# Patient Record
Sex: Female | Born: 1950 | Race: White | Hispanic: No | Smoking: Former smoker
Health system: Southern US, Community
[De-identification: ages and names within clinical notes are randomized; demographics above are authoritative.]

## PROBLEM LIST (undated history)

## (undated) DIAGNOSIS — I6529 Occlusion and stenosis of unspecified carotid artery: Secondary | ICD-10-CM

## (undated) DIAGNOSIS — K219 Gastro-esophageal reflux disease without esophagitis: Secondary | ICD-10-CM

## (undated) DIAGNOSIS — E079 Disorder of thyroid, unspecified: Secondary | ICD-10-CM

## (undated) HISTORY — DX: Occlusion and stenosis of unspecified carotid artery: I65.29

## (undated) HISTORY — DX: Gastro-esophageal reflux disease without esophagitis: K21.9

## (undated) HISTORY — DX: Disorder of thyroid, unspecified: E07.9

---

## 1975-09-09 HISTORY — PX: THYROIDECTOMY: SHX17

## 2003-09-09 HISTORY — PX: CHOLECYSTECTOMY: SHX55

## 2003-09-09 HISTORY — PX: GALLBLADDER SURGERY: SHX652

## 2012-11-30 ENCOUNTER — Ambulatory Visit (INDEPENDENT_AMBULATORY_CARE_PROVIDER_SITE_OTHER): Payer: BLUE CROSS/BLUE SHIELD | Admitting: Endocrinology

## 2012-11-30 ENCOUNTER — Encounter: Payer: Self-pay | Admitting: Endocrinology

## 2012-11-30 VITALS — BP 130/80 | HR 90 | Ht 65.0 in | Wt 144.0 lb

## 2012-11-30 DIAGNOSIS — F329 Major depressive disorder, single episode, unspecified: Secondary | ICD-10-CM

## 2012-11-30 DIAGNOSIS — E039 Hypothyroidism, unspecified: Secondary | ICD-10-CM

## 2012-11-30 DIAGNOSIS — E785 Hyperlipidemia, unspecified: Secondary | ICD-10-CM

## 2012-11-30 DIAGNOSIS — J45909 Unspecified asthma, uncomplicated: Secondary | ICD-10-CM

## 2012-11-30 DIAGNOSIS — K219 Gastro-esophageal reflux disease without esophagitis: Secondary | ICD-10-CM

## 2012-11-30 NOTE — Patient Instructions (Addendum)
Please continue the same levothyroxine (100 mcg/day). Please redo the blood test as scheduled in late April, and ask that a copy be sent to me.

## 2012-11-30 NOTE — Progress Notes (Signed)
  Subjective:    Patient ID: Alexandria Mitchell, female    DOB: 08/01/1951, 62 y.o.   MRN: [id number]  HPI Pt was dx'ed with hyperthyroidism 50 years ago.  She had "95% thyroidectomy" in 1977.  She has been on synthroid since then.  She has had frequent dosage changes, though (88-112 mcg/day).  She has moderate hair loss on the head, and assoc dry skin. No past medical history on file.  No past surgical history on file.  History   Social History  . Marital Status: Unknown    Spouse Name: N/A    Number of Children: N/A  . Years of Education: N/A   Occupational History  . Not on file.   Social History Main Topics  . Smoking status: Not on file  . Smokeless tobacco: Not on file  . Alcohol Use: Not on file  . Drug Use: Not on file  . Sexually Active: Not on file   Other Topics Concern  . Not on file   Social History Narrative  . No narrative on file    No current outpatient prescriptions on file prior to visit.   No current facility-administered medications on file prior to visit.    Not on File  No family history on file. Father had i-131 rx for hyperthyroidism BP 130/80  Pulse 90  Ht 5\' 5"  (1.651 m)  Wt 144 lb (65.318 kg)  BMI 23.96 kg/m2  SpO2 98%  Review of Systems denies depression, cramps, sob, weight gain, difficulty with congentration, constipation, numbness, blurry vision, rhinorrhea, easy bruising, and syncope.  She has arthralgias and brittle nails.     Objective:   Physical Exam VS: see vs page GEN: no distress HEAD: head: no deformity eyes: no periorbital swelling, no proptosis external nose and ears are normal mouth: no lesion seen NECK: a healed scar is present.  i do not appreciate a nodule in the thyroid or elsewhere in the neck CHEST WALL: no deformity LUNGS:  Clear to auscultation CV: reg rate and rhythm, no murmur ABD: abdomen is soft, nontender.  no hepatosplenomegaly.  not distended.  no hernia MUSCULOSKELETAL: muscle bulk and strength are  grossly normal.  no obvious joint swelling.  gait is normal and steady EXTEMITIES: no deformity.  no ulcer on the feet.  feet are of normal color and temp.  no edema PULSES: dorsalis pedis intact bilat.  no carotid bruit NEURO:  cn 2-12 grossly intact.   readily moves all 4's.  sensation is intact to touch on the feet SKIN:  Normal texture and temperature.  No rash or suspicious lesion is visible.   NODES:  None palpable at the neck PSYCH: alert, oriented x3.  Does not appear anxious nor depressed.   outside test results are reviewed: TSH=1.06 (pt says this was on synthroid 112 mcg/day)    Assessment & Plan:  Chronic hypothyroidism, on synthroid Hair loss: unlikely to be thyroid-related Brittle nails, not thyroid-related

## 2012-12-02 DIAGNOSIS — F329 Major depressive disorder, single episode, unspecified: Secondary | ICD-10-CM | POA: Insufficient documentation

## 2012-12-02 DIAGNOSIS — K219 Gastro-esophageal reflux disease without esophagitis: Secondary | ICD-10-CM | POA: Insufficient documentation

## 2012-12-02 DIAGNOSIS — E039 Hypothyroidism, unspecified: Secondary | ICD-10-CM | POA: Insufficient documentation

## 2012-12-02 DIAGNOSIS — J4540 Moderate persistent asthma, uncomplicated: Secondary | ICD-10-CM | POA: Insufficient documentation

## 2012-12-02 DIAGNOSIS — E785 Hyperlipidemia, unspecified: Secondary | ICD-10-CM | POA: Insufficient documentation

## 2013-01-12 ENCOUNTER — Other Ambulatory Visit: Payer: Self-pay | Admitting: *Deleted

## 2013-02-11 ENCOUNTER — Encounter: Payer: Self-pay | Admitting: Surgery

## 2013-02-14 ENCOUNTER — Other Ambulatory Visit (INDEPENDENT_AMBULATORY_CARE_PROVIDER_SITE_OTHER): Payer: BLUE CROSS/BLUE SHIELD | Admitting: *Deleted

## 2013-02-14 ENCOUNTER — Ambulatory Visit (INDEPENDENT_AMBULATORY_CARE_PROVIDER_SITE_OTHER): Payer: BLUE CROSS/BLUE SHIELD | Admitting: Surgery

## 2013-02-14 ENCOUNTER — Encounter: Payer: Self-pay | Admitting: Surgery

## 2013-02-14 ENCOUNTER — Encounter: Payer: BLUE CROSS/BLUE SHIELD | Admitting: Surgery

## 2013-02-14 ENCOUNTER — Other Ambulatory Visit: Payer: BLUE CROSS/BLUE SHIELD

## 2013-02-14 ENCOUNTER — Other Ambulatory Visit: Payer: Self-pay | Admitting: *Deleted

## 2013-02-14 DIAGNOSIS — I6529 Occlusion and stenosis of unspecified carotid artery: Secondary | ICD-10-CM

## 2013-02-14 NOTE — Progress Notes (Signed)
[facility]   Patient name: Alexandria Mitchell MRN: [id number] DOB: September 04, 1951 Sex: female   Referred by: Dr. Chestine Spore  Reason for referral:  Chief Complaint  Patient presents with  . 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 She originally had her carotid stenosis identified in 2005 during a [employer] valuation. She has been followed with serial ultrasound. Her most recent ultrasound identified greater than 80% left carotid stenosis. She is here today for further evaluation. She denies having any symptoms. Specifically, she denies numbness or weakness in either extremity. She denies slurred speech. She denies amaurosis fugax.  The patient has been started on simvastatin. Her most recent LDL cholesterol was 70. She has a remote history of smoking but has been quit for greater than 25 years. She has a history of thyroid disease. She underwent a thyroidectomy, of 95% of the gland, and 1977. She recently has had a difficult time regulating her thyroid medication. She is also treated with a proton pump inhibitor for her gastroesophageal reflux disease.  Past Medical History  Diagnosis Date  . Asthma   . GERD (gastroesophageal reflux disease)   . Thyroid disease     Past Surgical History  Procedure Laterality Date  . Gallbladder surgery  2005  . Thyroidectomy  1977    History   Social History  . Marital Status: Unknown    Spouse Name: N/A    Number of Children: N/A  . Years of Education: N/A   Occupational History  . Not on file.   Social History Main Topics  . Smoking status: Former Smoker    Types: Cigarettes    Start date: 02/15/1988  . Smokeless tobacco: Never Used  . Alcohol Use: 3.6 oz/week    6 Shots of liquor per week  . Drug Use: No  . Sexually Active: Not on file   Other Topics Concern  . Not on file   Social History  Narrative  . No narrative on file    Family History  Problem Relation Age of Onset  . Kidney disease Mother   . Kidney disease Father   . Cancer Father   . Heart disease Father   . Hyperlipidemia Father   . Hypertension Father   . Heart attack Father   . Diabetes Sister   . Heart disease Brother   . Hyperlipidemia Brother   . Hypertension Brother   . Peripheral vascular disease Brother     Allergies as of 02/14/2013 - Review Complete 02/14/2013  Allergen Reaction Noted  . Sulfa antibiotics Rash 02/14/2013    Current Outpatient Prescriptions on File Prior to Visit  Medication Sig Dispense Refill  . aspirin 81 MG tablet Take 81 mg by mouth daily.      . budesonide-formoterol (SYMBICORT) 160-4.5 MCG/ACT inhaler Inhale 2 puffs into the lungs 2 (two) times daily.      . cholecalciferol (VITAMIN D) 1000 UNITS tablet Take 1,000 Units by mouth daily.      . citalopram (CELEXA) 20 MG tablet Take 20 mg by mouth daily.      . furosemide (LASIX) 40 MG tablet Take 40 mg by mouth daily.      Marland Kitchen levothyroxine (SYNTHROID, LEVOTHROID) 100 MCG tablet Take 100 mcg by mouth daily.      . pantoprazole (PROTONIX) 40 MG tablet Take 40 mg by mouth daily.      Marland Kitchen  simvastatin (ZOCOR) 40 MG tablet Take 40 mg by mouth every evening.       No current facility-administered medications on file prior to visit.     REVIEW OF SYSTEMS: Cardiovascular: No chest pain, chest pressure, palpitations, orthopnea, or dyspnea on exertion. No claudication or rest pain,  No history of DVT or phlebitis. Pulmonary: Positive for asthma Neurologic: No weakness, paresthesias, aphasia, or amaurosis. No dizziness. Hematologic: No bleeding problems or clotting disorders. Musculoskeletal: No joint pain or joint swelling. Gastrointestinal: No blood in stool or hematemesis Genitourinary: No dysuria or hematuria. Psychiatric:: No history of major depression. Integumentary: No rashes or ulcers. Constitutional: No fever or  chills.  PHYSICAL EXAMINATION: General: The patient appears their stated age.  Vital signs are BP 134/62  Pulse 72  Ht 5\' 5"  (1.651 m)  Wt 141 lb 3.2 oz (64.048 kg)  BMI 23.5 kg/m2  SpO2 100% HEENT:  No gross abnormalities Pulmonary: Respirations are non-labored Abdomen: Soft and non-tender. Aorta is palpable and not aneurysmal  Musculoskeletal: There are no major deformities.   Neurologic: No focal weakness or paresthesias are detected, Skin: There are no ulcer or rashes noted. Psychiatric: The patient has normal affect. Cardiovascular: There is a regular rate and rhythm without significant murmur appreciated. No carotid bruits. Palpable pedal pulses bilaterally.  Diagnostic Studies: Carotid ultrasound was ordered and reviewed today. This shows a 40-59% right carotid stenosis and a 60-79% left carotid stenosis. The highest end diastolic velocity on the left is 68 cm/s  Outside Studies/Documentation Historical records were reviewed.  They showed high-grade left carotid stenosis, asymptomatic  Medication Changes: None  Assessment:  Bilateral carotid stenosis, asymptomatic, left greater than right Plan: I discussed the ultrasound findings today with the patient. The ultrasound performed in our office today suggests that her stenosis is less than 80%. This is in contrast to the outside ultrasound, however with an end-diastolic velocity of only 68 cm/s I would favor this being more accurate. Regardless, the patient remained asymptomatic. I therefore do not feel that we need to order a confirmatory test such as a CT scan or formal angiography. Rather, as long as she remains asymptomatic, I would repeat her duplex ultrasound in 6 months. The patient isn't agreeable with this plan. She will followup with me in 6 months. We did discuss that if she were to develop a sign or symptom of a stroke to go to the emergency department immediately.     Jorge Ny, M.D. [facility] [place] Office: [phone] Pager:  [phone]

## 2013-03-07 ENCOUNTER — Other Ambulatory Visit: Payer: BLUE CROSS/BLUE SHIELD

## 2013-03-07 ENCOUNTER — Encounter: Payer: BLUE CROSS/BLUE SHIELD | Admitting: Surgery

## 2013-08-03 ENCOUNTER — Encounter: Payer: Self-pay | Admitting: Surgery

## 2013-08-08 ENCOUNTER — Ambulatory Visit (INDEPENDENT_AMBULATORY_CARE_PROVIDER_SITE_OTHER): Payer: BC Managed Care – PPO | Admitting: Surgery

## 2013-08-08 ENCOUNTER — Ambulatory Visit (HOSPITAL_COMMUNITY)
Admission: RE | Admit: 2013-08-08 | Discharge: 2013-08-08 | Disposition: A | Discharge status: Home / Self Care | Payer: BC Managed Care – PPO | Source: Ambulatory Visit | Attending: Surgery | Admitting: Surgery

## 2013-08-08 ENCOUNTER — Encounter: Payer: Self-pay | Admitting: Surgery

## 2013-08-08 DIAGNOSIS — I6529 Occlusion and stenosis of unspecified carotid artery: Secondary | ICD-10-CM

## 2013-08-08 NOTE — Progress Notes (Signed)
Patient name: Alexandria Mitchell MRN: [id number] DOB: [birth year] Sex: female     Chief Complaint  Patient presents with  . Carotid    6 mo F/up with vascular Lab study.    HISTORY OF PRESENT ILLNESS: This is a friend Luna Kitchens.  She is back today for followup of her carotid occlusive disease.  She was originally referred with an ultrasound that showed greater than 80% left carotid stenosis.  This was repeated in my office and we could not reproduce those results.  Because the patient remained asymptomatic, I elected not to get a confirmatory study but rather to bring her back in 6 months for followup.  She denies any interval changes since I last saw her.  She denies any neurologic symptoms.  She continues to take an 81 mg aspirin and a statin for hypercholesterolemia.  Past Medical History  Diagnosis Date  . Asthma   . GERD (gastroesophageal reflux disease)   . Thyroid disease   . Carotid artery occlusion     Past Surgical History  Procedure Laterality Date  . Gallbladder surgery  2005  . Thyroidectomy  1977  . Cholecystectomy  2005    Gall Bladder    History   Social History  . Marital Status: Unknown    Spouse Name: N/A    Number of Children: N/A  . Years of Education: N/A   Occupational History  . Not on file.   Social History Main Topics  . Smoking status: Former Smoker    Types: Cigarettes    Start date: 02/15/1988  . Smokeless tobacco: Never Used  . Alcohol Use: 3.6 oz/week    6 Shots of liquor per week  . Drug Use: No  . Sexual Activity: Not on file   Other Topics Concern  . Not on file   Social History Narrative  . No narrative on file    Family History  Problem Relation Age of Onset  . Kidney disease Mother   . Kidney disease Father   . Cancer Father   . Heart disease Father     Heart Disease before age 56- Atrial Fibrillation  . Hyperlipidemia Father   . Hypertension Father   . Heart attack Father   . Diabetes Sister   . Heart  disease Brother     Heart Disease before age 66  . Hyperlipidemia Brother   . Hypertension Brother   . Peripheral vascular disease Brother     Allergies as of 08/08/2013 - Review Complete 08/08/2013  Allergen Reaction Noted  . Sulfa antibiotics Rash 02/14/2013    Current Outpatient Prescriptions on File Prior to Visit  Medication Sig Dispense Refill  . aspirin 81 MG tablet Take 81 mg by mouth daily.      . budesonide-formoterol (SYMBICORT) 160-4.5 MCG/ACT inhaler Inhale 2 puffs into the lungs 2 (two) times daily.      . cholecalciferol (VITAMIN D) 1000 UNITS tablet Take 1,000 Units by mouth daily.      . furosemide (LASIX) 40 MG tablet Take 40 mg by mouth daily.      Marland Kitchen levothyroxine (SYNTHROID, LEVOTHROID) 100 MCG tablet Take 100 mcg by mouth daily. 100 mg alt. 88 mg every other day      . pantoprazole (PROTONIX) 40 MG tablet Take 40 mg by mouth daily.      . simvastatin (ZOCOR) 40 MG tablet Take 40 mg by mouth every evening.      . citalopram (CELEXA) 20 MG tablet Take  20 mg by mouth daily.       No current facility-administered medications on file prior to visit.     REVIEW OF SYSTEMS: No changes from prior visit  PHYSICAL EXAMINATION:   Vital signs are BP 123/67  Pulse 70  Resp 16  Ht 5' 5.5" (1.664 m)  Wt 137 lb (62.143 kg)  BMI 22.44 kg/m2  SpO2 97% General: The patient appears their stated age. HEENT:  No gross abnormalities Pulmonary:  Non labored breathing Musculoskeletal: There are no major deformities. Neurologic: No focal weakness or paresthesias are detected, Skin: There are no ulcer or rashes noted. Psychiatric: The patient has normal affect. Cardiovascular: There is a regular rate and rhythm without significant murmur appreciated.  Faint left carotid bruit   Diagnostic Studies Carotid ultrasound was performed today.  This shows no significant interval changes.  The right side shows 40-59% stenosis in the left remained 60-79% stenosis with a and a  diastolic velocity of 75  Assessment: Asymptomatic bilateral carotid stenosis, left greater than right Plan: I discussed the ultrasound findings today with the patient.  I have recommended continuation of maximal medical therapy.  I would not entertain carotid endarterectomy or stenting until the stenosis reaches greater than 80%.  I have scheduled the patient to followup in one year with a repeat carotid ultrasound.  Jorge Ny, M.D. [facility] [place] Office: [phone] Pager:  [phone]

## 2013-08-09 NOTE — Addendum Note (Signed)
Addended by: Adria Dill L on: 08/09/2013 10:08 AM   Modules accepted: Orders

## 2014-08-14 ENCOUNTER — Encounter: Payer: Self-pay | Admitting: Surgery

## 2014-08-14 ENCOUNTER — Ambulatory Visit (HOSPITAL_COMMUNITY)
Admission: RE | Admit: 2014-08-14 | Discharge: 2014-08-14 | Disposition: A | Discharge status: Home / Self Care | Payer: Medicare HMO | Source: Ambulatory Visit | Attending: Surgery | Admitting: Surgery

## 2014-08-14 ENCOUNTER — Ambulatory Visit (INDEPENDENT_AMBULATORY_CARE_PROVIDER_SITE_OTHER): Payer: Medicare HMO | Admitting: Surgery

## 2014-08-14 VITALS — BP 134/61 | HR 72 | Ht 65.5 in | Wt 147.4 lb

## 2014-08-14 DIAGNOSIS — I6523 Occlusion and stenosis of bilateral carotid arteries: Secondary | ICD-10-CM

## 2014-08-14 NOTE — Addendum Note (Signed)
Addended by: Sharee PimpleMCCHESNEY, Mersedes Alber K on: 08/14/2014 04:12 PM   Modules accepted: Orders

## 2014-08-14 NOTE — Progress Notes (Signed)
Patient name: Alexandria Mitchell MRN: [id number] DOB: 11/30/1950 Sex: female     Chief Complaint  Patient presents with  . Re-evaluation    1 year f/u carotid    HISTORY OF PRESENT ILLNESS: This is a friend [name]. She is back today for followup of her carotid occlusive disease. She was originally referred with an ultrasound that showed greater than 80% left carotid stenosis. This was repeated in my office and we could not reproduce those results. Because the patient remained asymptomatic  She reports no new symptoms since I last saw her.  Specifically, she denies numbness or weakness in either extremity.  She denies slurred speech.  She denies amaurosis fugax.  She reports no symptoms of claudication.  Past Medical History  Diagnosis Date  . Asthma   . GERD (gastroesophageal reflux disease)   . Thyroid disease   . Carotid artery occlusion     Past Surgical History  Procedure Laterality Date  . Gallbladder surgery  2005  . Thyroidectomy  1977  . Cholecystectomy  2005    Gall Bladder    History   Social History  . Marital Status: Unknown    Spouse Name: N/A    Number of Children: N/A  . Years of Education: N/A   Occupational History  . Not on file.   Social History Main Topics  . Smoking status: Former Smoker    Types: Cigarettes    Start date: 02/15/1988  . Smokeless tobacco: Never Used  . Alcohol Use: 3.6 oz/week    6 Shots of liquor per week  . Drug Use: No  . Sexual Activity: Not on file   Other Topics Concern  . Not on file   Social History Narrative    Family History  Problem Relation Age of Onset  . Kidney disease Mother   . Kidney disease Father   . Cancer Father   . Heart disease Father     Heart Disease before age 65- Atrial Fibrillation  . Hyperlipidemia Father   . Hypertension Father   . Heart attack Father   . Diabetes Sister   . Heart disease Brother     Heart Disease before age 65  . Hyperlipidemia Brother   .  Hypertension Brother   . Peripheral vascular disease Brother     Allergies as of 08/14/2014 - Review Complete 08/14/2014  Allergen Reaction Noted  . Sulfa antibiotics Rash 02/14/2013    Current Outpatient Prescriptions on File Prior to Visit  Medication Sig Dispense Refill  . aspirin 81 MG tablet Take 81 mg by mouth daily.    . budesonide-formoterol (SYMBICORT) 160-4.5 MCG/ACT inhaler Inhale 2 puffs into the lungs 2 (two) times daily.    . cholecalciferol (VITAMIN D) 1000 UNITS tablet Take 1,000 Units by mouth daily.    Marland Kitchen. escitalopram (LEXAPRO) 20 MG tablet Take 20 mg by mouth daily.    . furosemide (LASIX) 40 MG tablet Take 40 mg by mouth daily.    Marland Kitchen. levothyroxine (SYNTHROID, LEVOTHROID) 100 MCG tablet Take 100 mcg by mouth daily. 100 mg alt. 88 mg every other day    . pantoprazole (PROTONIX) 40 MG tablet Take 40 mg by mouth daily.    . simvastatin (ZOCOR) 40 MG tablet Take 40 mg by mouth every evening.    . citalopram (CELEXA) 20 MG tablet Take 20 mg by mouth daily.     No current facility-administered medications on file prior to visit.     REVIEW OF  SYSTEMS: Cardiovascular: No chest pain, chest pressure, palpitations, orthopnea, or dyspnea on exertion. No claudication or rest pain,  No history of DVT or phlebitis. Pulmonary: No productive cough, or wheezing.  Positive for asthma Neurologic: No weakness, paresthesias, aphasia, or amaurosis. No dizziness. Hematologic: No bleeding problems or clotting disorders. Musculoskeletal: No joint pain or joint swelling. Gastrointestinal: No blood in stool or hematemesis Genitourinary: No dysuria or hematuria. Psychiatric:: No history of major depression. Integumentary: No rashes or ulcers. Constitutional: No fever or chills.  PHYSICAL EXAMINATION:   Vital signs are BP 134/61 mmHg  Pulse 72  Ht 5' 5.5" (1.664 m)  Wt 147 lb 6.4 oz (66.86 kg)  BMI 24.15 kg/m2  SpO2 98% General: The patient appears their stated age. HEENT:  No gross  abnormalities Pulmonary:  Non labored breathing Abdomen: Soft and non-tender.  No pulsatile mass Musculoskeletal: There are no major deformities. Neurologic: No focal weakness or paresthesias are detected, Skin: There are no ulcer or rashes noted. Psychiatric: The patient has normal affect. Cardiovascular: There is a regular rate and rhythm without significant murmur appreciated.  No carotid bruits.  Palpable posterior tibial pulse bilaterally   Diagnostic Studies I have ordered and reviewed her carotid Doppler study.  This shows 40-59 percent stenosis on the right and 60-79% stenosis on the left.  There is no significant interval change from 2014.  Assessment: Asymptomatic bilateral carotid stenosis, left greater than right Plan: I discussed the importance of yearly surveillance.  She remains asymptomatic.  She will follow up in one year with a repeat carotid Doppler study.  Jorge NyV. Wells Emmelyn Schmale IV, M.D. [facility] [place] Office: [phone] Pager:  [phone]

## 2014-09-06 DIAGNOSIS — J939 Pneumothorax, unspecified: Secondary | ICD-10-CM | POA: Insufficient documentation

## 2014-09-06 DIAGNOSIS — S2242XA Multiple fractures of ribs, left side, initial encounter for closed fracture: Secondary | ICD-10-CM | POA: Insufficient documentation

## 2014-09-06 DIAGNOSIS — W19XXXA Unspecified fall, initial encounter: Secondary | ICD-10-CM | POA: Insufficient documentation

## 2015-01-07 DIAGNOSIS — M85852 Other specified disorders of bone density and structure, left thigh: Secondary | ICD-10-CM | POA: Insufficient documentation

## 2015-05-18 ENCOUNTER — Other Ambulatory Visit: Payer: Self-pay | Admitting: *Deleted

## 2015-05-18 MED ORDER — OMALIZUMAB 150 MG ~~LOC~~ SOLR: 300.0000 mg | SUBCUTANEOUS | Status: DC

## 2015-08-14 ENCOUNTER — Encounter: Payer: Self-pay | Admitting: Surgery

## 2015-08-20 ENCOUNTER — Ambulatory Visit (INDEPENDENT_AMBULATORY_CARE_PROVIDER_SITE_OTHER): Payer: Medicare HMO | Admitting: Surgery

## 2015-08-20 ENCOUNTER — Ambulatory Visit (HOSPITAL_COMMUNITY)
Admission: RE | Admit: 2015-08-20 | Discharge: 2015-08-20 | Disposition: A | Discharge status: Home / Self Care | Payer: Medicare HMO | Source: Ambulatory Visit | Attending: Surgery | Admitting: Surgery

## 2015-08-20 ENCOUNTER — Encounter: Payer: Self-pay | Admitting: Surgery

## 2015-08-20 VITALS — BP 135/70 | HR 71 | Ht 65.5 in | Wt 149.0 lb

## 2015-08-20 DIAGNOSIS — I6523 Occlusion and stenosis of bilateral carotid arteries: Secondary | ICD-10-CM

## 2015-08-20 NOTE — Progress Notes (Signed)
Patient name: Alexandria Mitchell MRN: [id number] DOB: 1951-09-06 Sex: female     Chief Complaint  Patient presents with  . Re-evaluation    1 year f/u - carotid     HISTORY OF PRESENT ILLNESS: This is a [name]. She is back today for followup of her carotid occlusive disease. She was originally referred with an ultrasound that showed greater than 80% left carotid stenosis. This was repeated in my office and we could not reproduce those results. Because the patient remained asymptomatic, no intervention was performed.  Since I last saw her she has not had significant medical issues with the exception of a fall where she broke a stent had a punctured lung.  She denies symptoms.  Specifically, she denies numbness or weakness in either extremity.  She denies slurred speech.  She denies amaurosis fugax.  Past Medical History  Diagnosis Date  . Asthma   . GERD (gastroesophageal reflux disease)   . Thyroid disease   . Carotid artery occlusion     Past Surgical History  Procedure Laterality Date  . Gallbladder surgery  2005  . Thyroidectomy  1977  . Cholecystectomy  2005    Gall Bladder    Social History   Social History  . Marital Status: Unknown    Spouse Name: N/A  . Number of Children: N/A  . Years of Education: N/A   Occupational History  . Not on file.   Social History Main Topics  . Smoking status: Former Smoker    Types: Cigarettes    Start date: 02/15/1988  . Smokeless tobacco: Never Used  . Alcohol Use: 3.6 oz/week    6 Shots of liquor per week  . Drug Use: No  . Sexual Activity: Not on file   Other Topics Concern  . Not on file   Social History Narrative    Family History  Problem Relation Age of Onset  . Kidney disease Mother   . Kidney disease Father   . Cancer Father   . Heart disease Father     Heart Disease before age 43- Atrial Fibrillation  . Hyperlipidemia Father   . Hypertension Father   . Heart attack Father   .  Diabetes Sister   . Heart disease Brother     Heart Disease before age 42  . Hyperlipidemia Brother   . Hypertension Brother   . Peripheral vascular disease Brother     Allergies as of 08/20/2015 - Review Complete 08/20/2015  Allergen Reaction Noted  . Hydrochlorothiazide Rash 08/20/2015  . Sulfa antibiotics Rash 02/14/2013    Current Outpatient Prescriptions on File Prior to Visit  Medication Sig Dispense Refill  . aspirin 81 MG tablet Take 81 mg by mouth daily.    . budesonide-formoterol (SYMBICORT) 160-4.5 MCG/ACT inhaler Inhale 2 puffs into the lungs 2 (two) times daily.    . cholecalciferol (VITAMIN D) 1000 UNITS tablet Take 1,000 Units by mouth daily.    Marland Kitchen escitalopram (LEXAPRO) 20 MG tablet Take 20 mg by mouth daily.    . furosemide (LASIX) 40 MG tablet Take 40 mg by mouth daily.    Marland Kitchen levothyroxine (SYNTHROID, LEVOTHROID) 100 MCG tablet Take 100 mcg by mouth daily. 100 mg alt. 88 mg every other day    . pantoprazole (PROTONIX) 40 MG tablet Take 40 mg by mouth daily.    . citalopram (CELEXA) 20 MG tablet Take 20 mg by mouth daily.    . simvastatin (ZOCOR) 40 MG tablet Take  40 mg by mouth every evening.     Current Facility-Administered Medications on File Prior to Visit  Medication Dose Route Frequency Provider Last Rate Last Dose  . omalizumab Geoffry Paradise(XOLAIR) injection 300 mg  300 mg Subcutaneous Q28 days Mikki SanteeSokun Bhatti, MD         REVIEW OF SYSTEMS: Cardiovascular: No chest pain, chest pressure, palpitations, orthopnea, or dyspnea on exertion. No claudication or rest pain,  No history of DVT or phlebitis. Pulmonary: No productive cough, asthma or wheezing. Neurologic: No weakness, paresthesias, aphasia, or amaurosis. No dizziness. Hematologic: No bleeding problems or clotting disorders. Musculoskeletal: No joint pain or joint swelling. Gastrointestinal: No blood in stool or hematemesis Genitourinary: No dysuria or hematuria. Psychiatric:: No history of major  depression. Integumentary: No rashes or ulcers. Constitutional: No fever or chills.  PHYSICAL EXAMINATION:   Vital signs are  Filed Vitals:   08/20/15 1058 08/20/15 1059  BP: 122/61 135/70  Pulse: 71   Height: 5' 5.5" (1.664 m)   Weight: 149 lb (67.586 kg)   SpO2: 98%    Body mass index is 24.41 kg/(m^2). General: The patient appears their stated age. HEENT:  No gross abnormalities Pulmonary:  Non labored breathing Musculoskeletal: There are no major deformities. Neurologic: No focal weakness or paresthesias are detected, Skin: There are no ulcer or rashes noted. Psychiatric: The patient has normal affect. Cardiovascular: There is a regular rate and rhythm without significant murmur appreciated.  No carotid bruits   Diagnostic Studies I have ordered and reviewed her carotid duplex.  This reveals 40-59 percent right-sided stenosis and 60-an absent left sided stenosis.  There are no significant changes from prior visit  Assessment: Asymptomatic carotid stenosis, left carotid artery Plan: The patient remains asymptomatic and her carotid duplex remains stable.  We will continue with annual surveillance  V. Charlena CrossWells Haylynn Pha IV, M.D. [facility] [place] Office: [phone] Pager:  [phone]

## 2015-08-20 NOTE — Addendum Note (Signed)
Addended by: Adria DillELDRIDGE-LEWIS, Anastasha Ortez L on: 08/20/2015 03:48 PM   Modules accepted: Orders

## 2015-10-17 ENCOUNTER — Ambulatory Visit (INDEPENDENT_AMBULATORY_CARE_PROVIDER_SITE_OTHER): Payer: Medicare HMO | Admitting: Internal Medicine

## 2015-10-17 ENCOUNTER — Encounter: Payer: Self-pay | Admitting: Internal Medicine

## 2015-10-17 VITALS — BP 128/70 | HR 84 | Temp 98.5°F | Resp 20 | Ht 65.16 in | Wt 153.9 lb

## 2015-10-17 DIAGNOSIS — J4541 Moderate persistent asthma with (acute) exacerbation: Secondary | ICD-10-CM

## 2015-10-17 DIAGNOSIS — J310 Chronic rhinitis: Secondary | ICD-10-CM | POA: Insufficient documentation

## 2015-10-17 MED ORDER — AMOXICILLIN-POT CLAVULANATE 875-125 MG PO TABS: 1.0000 | ORAL_TABLET | Freq: Two times a day (BID) | ORAL | Status: DC

## 2015-10-17 MED ORDER — ALBUTEROL SULFATE (2.5 MG/3ML) 0.083% IN NEBU: 2.5000 mg | INHALATION_SOLUTION | RESPIRATORY_TRACT | Status: DC | PRN

## 2015-10-17 MED ORDER — METHYLPREDNISOLONE ACETATE 80 MG/ML IJ SUSP
80.0000 mg | Freq: Once | INTRAMUSCULAR | Status: AC
  Administered 2015-10-17: 80 mg via INTRAMUSCULAR

## 2015-10-17 NOTE — Patient Instructions (Signed)
Moderate persistent asthma  Currently not well controlled due to infection  Given DepoMedrol 80 mg IM today  Form completed for a nebulizer  Continue symbicort 160 mcg 2 puffs twice a day  Continue albuterol as needed  Chronic rhinitis  Currently not well controlled due to bronchitis  Start Augmentin 875 mg twice a day for 10 days  Continue Astelin 2 sprays each nostril once or twice a day as needed along with nasal saline lavage

## 2015-10-17 NOTE — Assessment & Plan Note (Signed)
   Currently not well controlled due to infection  Given DepoMedrol 80 mg IM today  Form completed for a nebulizer  Continue symbicort 160 mcg 2 puffs twice a day  Continue albuterol as needed

## 2015-10-17 NOTE — Assessment & Plan Note (Signed)
   Currently not well controlled due to bronchitis  Start Augmentin 875 mg twice a day for 10 days  Continue Astelin 2 sprays each nostril once or twice a day as needed along with nasal saline lavage

## 2015-10-17 NOTE — Progress Notes (Signed)
History of Present Illness: Alexandria Mitchell is a 65 y.o. female presenting for follow-up  HPI Comments: Asthma: For the past 2-3 weeks, she has had cough productive of yellow mucus, intermittent fever, fatigue, nasal drainage. She was seen at an urgent care center and given azithromycin, a prednisone burst and Tessalon Perles with only transient improvement in her symptoms.  Chronic rhinitis: Symptoms have been stable until recently as above  Urticaria: She is now off of Xolair without any return of her symptoms.   Assessment and Plan: Moderate persistent asthma  Currently not well controlled due to infection  Given DepoMedrol 80 mg IM today  Form completed for a nebulizer  Continue symbicort 160 mcg 2 puffs twice a day  Continue albuterol as needed  Chronic rhinitis  Currently not well controlled due to bronchitis  Start Augmentin 875 mg twice a day for 10 days  Continue Astelin 2 sprays each nostril once or twice a day as needed along with nasal saline lavage    Return in about 4 weeks (around 11/14/2015).  Medications ordered this encounter:  Meds ordered this encounter  Medications  . azelastine (ASTELIN) 0.1 % nasal spray    Sig: Place into the nose.  . calcium citrate-vitamin D (CITRACAL+D) 315-200 MG-UNIT tablet    Sig: Take by mouth.  . DISCONTD: EPINEPHrine (EPIPEN 2-PAK) 0.3 mg/0.3 mL IJ SOAJ injection    Sig:   . benzonatate (TESSALON) 200 MG capsule    Sig: one capsule prn    Refill:  0  . methylPREDNISolone acetate (DEPO-MEDROL) injection 80 mg    Sig:   . albuterol (PROVENTIL) (2.5 MG/3ML) 0.083% nebulizer solution    Sig: Take 3 mLs (2.5 mg total) by nebulization every 4 (four) hours as needed for wheezing or shortness of breath.    Dispense:  75 mL    Refill:  1  . amoxicillin-clavulanate (AUGMENTIN) 875-125 MG tablet    Sig: Take 1 tablet by mouth 2 (two) times daily.    Dispense:  20 tablet    Refill:  0    For infection     Diagnostics: Spirometry: FEV1 1.13L or 46%, FEV1/FVC  53%.  His is consistent with mild restriction and moderate airwaysstruction  Physical Exam: BP 128/70 mmHg  Pulse 84  Temp(Src) 98.5 F (36.9 C) (Oral)  Resp 20  Ht 5' 5.16" (1.655 m)  Wt 153 lb 14.1 oz (69.8 kg)  BMI 25.48 kg/m2   Physical Exam  Constitutional: She appears well-developed and well-nourished. No distress.  HENT:  Right Ear: External ear normal.  Left Ear: External ear normal.  Nose: Nose normal.  Mouth/Throat: Oropharynx is clear and moist.  Eyes: Conjunctivae are normal. Right eye exhibits no discharge. Left eye exhibits no discharge.  Cardiovascular: Normal rate, regular rhythm and normal heart sounds.   No murmur heard. Pulmonary/Chest: Effort normal. No respiratory distress. She has wheezes (occasional end expiratory wheeze, improved after albuterol). She has no rales.  Abdominal: Soft. Bowel sounds are normal.  Musculoskeletal: She exhibits no edema.  Lymphadenopathy:    She has no cervical adenopathy.  Neurological: She is alert.  Skin: No rash noted.  Vitals reviewed.   Medications: Current outpatient prescriptions:  .  aspirin 81 MG tablet, Take 81 mg by mouth daily., Disp: , Rfl:  .  atorvastatin (LIPITOR) 40 MG tablet, Take 40 mg by mouth., Disp: , Rfl:  .  azelastine (ASTELIN) 0.1 % nasal spray, Place into the nose., Disp: , Rfl:  .  benzonatate (  TESSALON) 200 MG capsule, one capsule prn, Disp: , Rfl: 0 .  budesonide-formoterol (SYMBICORT) 160-4.5 MCG/ACT inhaler, Inhale 2 puffs into the lungs 2 (two) times daily., Disp: , Rfl:  .  calcium citrate-vitamin D (CITRACAL+D) 315-200 MG-UNIT tablet, Take by mouth., Disp: , Rfl:  .  cholecalciferol (VITAMIN D) 1000 UNITS tablet, Take 1,000 Units by mouth daily., Disp: , Rfl:  .  escitalopram (LEXAPRO) 20 MG tablet, Take 20 mg by mouth daily., Disp: , Rfl:  .  furosemide (LASIX) 40 MG tablet, Take 40 mg by mouth daily., Disp: , Rfl:  .   levothyroxine (SYNTHROID, LEVOTHROID) 100 MCG tablet, Take 100 mcg by mouth daily. 100 mg alt. 88 mg every other day, Disp: , Rfl:  .  pantoprazole (PROTONIX) 40 MG tablet, Take 40 mg by mouth daily., Disp: , Rfl:  .  albuterol (PROVENTIL) (2.5 MG/3ML) 0.083% nebulizer solution, Take 3 mLs (2.5 mg total) by nebulization every 4 (four) hours as needed for wheezing or shortness of breath., Disp: 75 mL, Rfl: 1 .  amoxicillin-clavulanate (AUGMENTIN) 875-125 MG tablet, Take 1 tablet by mouth 2 (two) times daily., Disp: 20 tablet, Rfl: 0 .  citalopram (CELEXA) 20 MG tablet, Take 20 mg by mouth daily. Reported on 10/17/2015, Disp: , Rfl:   Drug Allergies:  Allergies  Allergen Reactions  . Hydrochlorothiazide Rash  . Sulfa Antibiotics Rash    ROS: Per HPI unless specifically indicated below Review of Systems  Thank you for the opportunity to care for this patient.  Please do not hesitate to contact me with questions.

## 2015-10-22 ENCOUNTER — Encounter: Payer: Self-pay | Admitting: *Deleted

## 2015-11-20 ENCOUNTER — Other Ambulatory Visit: Payer: Self-pay | Admitting: Allergy

## 2015-11-20 MED ORDER — AZELASTINE HCL 0.1 % NA SOLN: 2.0000 | Freq: Two times a day (BID) | NASAL | Status: DC

## 2015-11-21 ENCOUNTER — Ambulatory Visit: Payer: Medicare HMO | Admitting: Internal Medicine

## 2015-12-05 ENCOUNTER — Encounter: Payer: Self-pay | Admitting: Internal Medicine

## 2015-12-05 ENCOUNTER — Ambulatory Visit (INDEPENDENT_AMBULATORY_CARE_PROVIDER_SITE_OTHER): Payer: Medicare HMO | Admitting: Internal Medicine

## 2015-12-05 VITALS — BP 110/60 | HR 68 | Temp 98.3°F | Resp 16

## 2015-12-05 DIAGNOSIS — J4540 Moderate persistent asthma, uncomplicated: Secondary | ICD-10-CM | POA: Diagnosis not present

## 2015-12-05 DIAGNOSIS — J310 Chronic rhinitis: Secondary | ICD-10-CM | POA: Diagnosis not present

## 2015-12-05 NOTE — Progress Notes (Signed)
History of Present Illness: Alexandria Mitchell is a 65 y.o. female presenting for follow-up  HPI Comments: Asthma: At last visit, patient was treated with Depo-Medrol and Augmentin due to an asthma exacerbation exacerbated by infection. She now feels like she is back to her baseline. She is now coughing up a small amount of white mucus but her wheezing has resolved. She is still on Symbicort but would like to try Breo to see if it works on her.  Chronic rhinitis: Symptoms have been stable      Current Outpatient Prescriptions on File Prior to Visit  Medication Sig Dispense Refill  . albuterol (PROVENTIL) (2.5 MG/3ML) 0.083% nebulizer solution Take 3 mLs (2.5 mg total) by nebulization every 4 (four) hours as needed for wheezing or shortness of breath. 75 mL 1  . aspirin 81 MG tablet Take 81 mg by mouth daily.    Marland Kitchen atorvastatin (LIPITOR) 40 MG tablet Take 40 mg by mouth.    . budesonide-formoterol (SYMBICORT) 160-4.5 MCG/ACT inhaler Inhale 2 puffs into the lungs 2 (two) times daily.    . calcium citrate-vitamin D (CITRACAL+D) 315-200 MG-UNIT tablet Take by mouth.    . cholecalciferol (VITAMIN D) 1000 UNITS tablet Take 1,000 Units by mouth daily.    Marland Kitchen escitalopram (LEXAPRO) 20 MG tablet Take 20 mg by mouth daily.    . furosemide (LASIX) 40 MG tablet Take 40 mg by mouth daily.    Marland Kitchen levothyroxine (SYNTHROID, LEVOTHROID) 100 MCG tablet Take 100 mcg by mouth daily. 100 mg alt. 88 mg every other day    . pantoprazole (PROTONIX) 40 MG tablet Take 40 mg by mouth daily.    Marland Kitchen azelastine (ASTELIN) 0.1 % nasal spray Place 2 sprays into both nostrils 2 (two) times daily. (Patient not taking: Reported on 12/05/2015) 30 mL 5  . citalopram (CELEXA) 20 MG tablet Take 20 mg by mouth daily. Reported on 12/05/2015     No current facility-administered medications on file prior to visit.    Assessment and Plan: Moderate persistent asthma  Currently well controlled due to infection  Continue symbicort 160 mcg 2  puffs twice a day. Patient would like to try Breo ellipta. I've given her sample of 200 g; she will take 1 inhalation daily and she will let us know if she would like a prescription for it.  Continue albuterol as needed  If having frequent exacerbations, would likely benefit from Xolair.   Chronic rhinitis  Currently well controlled  Continue Astelin 2 sprays each nostril once or twice a day as needed along with nasal saline lavage     Return in about 6 months (around 06/06/2016).  Meds ordered this encounter  Medications  . DISCONTD: escitalopram (LEXAPRO) 10 MG tablet    Sig: TAKE 1 TABLET (10 MG TOTAL) BY MOUTH DAILY.    Refill:  5  . DISCONTD: fluconazole (DIFLUCAN) 100 MG tablet    Sig:   . ibuprofen (ADVIL,MOTRIN) 800 MG tablet    Sig: TAKE 1 TABLET EVERY 6 TO 8 HOURS AS NEEDED FOR PAIN    Refill:  0  . cetirizine (ZYRTEC) 10 MG tablet    Sig: Take 10 mg by mouth daily.    Diagnostics: Spirometry: FEV1 1.23 L or 50 %, FEV1/FVC  57 %.  This is consistent with mild restriction and moderate airways obstruction. No reversibility in the past.  Physical Exam: BP 110/60 mmHg  Pulse 68  Temp(Src) 98.3 F (36.8 C) (Oral)  Resp 16   Physical Exam  Constitutional: She appears well-developed and well-nourished. No distress.  HENT:  Right Ear: External ear normal.  Left Ear: External ear normal.  Nose: Nose normal.  Mouth/Throat: Oropharynx is clear and moist.  Eyes: Conjunctivae are normal. Right eye exhibits no discharge. Left eye exhibits no discharge.  Cardiovascular: Normal rate, regular rhythm and normal heart sounds.   No murmur heard. Pulmonary/Chest: Effort normal and breath sounds normal. No respiratory distress. She has no wheezes. She has no rales.  Abdominal: Soft. Bowel sounds are normal.  Musculoskeletal: She exhibits no edema.  Lymphadenopathy:    She has no cervical adenopathy.  Neurological: She is alert.  Skin: No rash noted.  Vitals  reviewed.   Drug Allergies:  Allergies  Allergen Reactions  . Hydrochlorothiazide Rash  . Sulfa Antibiotics Rash    ROS: Per HPI unless specifically indicated below Review of Systems  Thank you for the opportunity to care for this patient.  Please do not hesitate to contact me with questions.

## 2015-12-05 NOTE — Assessment & Plan Note (Addendum)
   Currently well controlled due to infection  Continue symbicort 160 mcg 2 puffs twice a day. Patient would like to try Breo ellipta. I've given her sample of 200 g; she will take 1 inhalation daily and she will let us know if she would like a prescription for it.  Continue albuterol as needed  If having frequent exacerbations, would likely benefit from Xolair.

## 2015-12-05 NOTE — Assessment & Plan Note (Signed)
   Currently well controlled  Continue Astelin 2 sprays each nostril once or twice a day as needed along with nasal saline lavage

## 2015-12-05 NOTE — Patient Instructions (Addendum)
Moderate persistent asthma  Currently well controlled due to infection  Continue symbicort 160 mcg 2 puffs twice a day. Patient would like to try Breo ellipta. I've given her sample of 200 g; she will take 1 inhalation daily and she will let us know if she would like a prescription for it.  Continue albuterol as needed  If having frequent exacerbations, would likely benefit from Xolair.   Chronic rhinitis  Currently well controlled  Continue Astelin 2 sprays each nostril once or twice a day as needed along with nasal saline lavage

## 2015-12-12 ENCOUNTER — Encounter: Payer: Self-pay | Admitting: *Deleted

## 2016-01-02 ENCOUNTER — Telehealth: Payer: Self-pay | Admitting: Allergy

## 2016-01-02 NOTE — Telephone Encounter (Signed)
Alexandria Mitchell CALLED AND SAID SHE WOULD LIKE A PRESCRIPTION FOR THE BREO 200. YOU GAVE HER A SAMPLE WHEN SHE WAS IN OFFICE. IT IS DOING GOOD.

## 2016-01-03 ENCOUNTER — Other Ambulatory Visit: Payer: Self-pay | Admitting: Allergy

## 2016-01-03 ENCOUNTER — Telehealth: Payer: Self-pay | Admitting: Allergy

## 2016-01-03 MED ORDER — FLUTICASONE FUROATE-VILANTEROL 200-25 MCG/INH IN AEPB: 1.0000 | INHALATION_SPRAY | Freq: Every day | RESPIRATORY_TRACT | Status: DC

## 2016-01-03 NOTE — Telephone Encounter (Signed)
OK to switch from Symbicort to Breo Ellipta 200 mg 1 inhalation daily

## 2016-01-03 NOTE — Telephone Encounter (Signed)
CALLED PATIENT AND LEFT MESSAGE THAT WE WERE CHANGING HER TO BREO PER DR BHATTI.

## 2016-01-03 NOTE — Telephone Encounter (Signed)
TALKED WITH PATIENT AND INFORMED HER NOT TO TAKE SYMBICORT.

## 2016-01-03 NOTE — Telephone Encounter (Signed)
PATIENT INFORMED.MEDS FAXED IN

## 2016-05-30 ENCOUNTER — Encounter: Payer: Self-pay | Admitting: Allergy & Immunology

## 2016-05-30 ENCOUNTER — Ambulatory Visit (INDEPENDENT_AMBULATORY_CARE_PROVIDER_SITE_OTHER): Payer: Medicare HMO | Admitting: Allergy & Immunology

## 2016-05-30 VITALS — BP 132/64 | HR 68 | Temp 98.4°F | Resp 16

## 2016-05-30 DIAGNOSIS — J4540 Moderate persistent asthma, uncomplicated: Secondary | ICD-10-CM | POA: Diagnosis not present

## 2016-05-30 DIAGNOSIS — J329 Chronic sinusitis, unspecified: Secondary | ICD-10-CM | POA: Diagnosis not present

## 2016-05-30 DIAGNOSIS — B999 Unspecified infectious disease: Secondary | ICD-10-CM

## 2016-05-30 DIAGNOSIS — Z87891 Personal history of nicotine dependence: Secondary | ICD-10-CM | POA: Insufficient documentation

## 2016-05-30 NOTE — Patient Instructions (Addendum)
1. Moderate persistent asthma, uncomplicated - Continue with Breo Ellipta 200/25 one puff every morning. - Continue with ProAir 4 puffs or the abuterol nebulizer every 4-6 hours as needed. - We will get a chest X-ray today to rule out pneumonia given the history of prolonged coughing. - We will get lab work today to see if you qualify for a new injectable medication: complete blood count with differential  2. Chronic sinusitis, unspecified location - We will get a sinus CT to see what the inside of your sinuses look like.  - We will hold off on additional antibiotics for now. - Use nasal saline rinses daily to help with your rhinitis symptoms.  3. Recurrent infections - We will get some screening labs to look for immunodeficiencies: immunoglobulin panel (IgG, IgA, IgM) and Pneumococcal titers.   4. Return in about 3 months (around 08/29/2016).  Please inform us of any Emergency Department visits, hospitalizations, or changes in symptoms. Call us before going to the ED for breathing or allergy symptoms since we might be able to fit you in for a sick visit. Feel free to contact us anytime with any questions, problems, or concerns.  It was a pleasure to meet you today!   Websites that have reliable patient information: 1. American Academy of Asthma, Allergy, and Immunology: www.aaaai.org 2. Food Allergy Research and Education (FARE): foodallergy.org 3. Mothers of Asthmatics: http://www.asthmacommunitynetwork.org 4. American College of Allergy, Asthma, and Immunology: www.acaai.org

## 2016-05-30 NOTE — Progress Notes (Signed)
FOLLOW UP  Date of Service/Encounter:  05/30/16   Assessment:   Moderate persistent asthma with COPD overlap  Chronic sinusitis, unspecified location  Recurrent infections    Plan/Recommendations:   1. Moderate persistent asthma with COPD overlap - Continue with Breo Ellipta 200/25 one puff every morning. - Continue with ProAir 4 puffs or the abuterol nebulizer every 4-6 hours as needed. - Lung function has overall trended downward over the last 1-2 years. - If no improvement at the next visit, we may consider a Pulmonology referral in conjunction with a chest CT.  - We will get a chest X-ray today to rule out pneumonia given the history of prolonged coughing. - We will get lab work today to see if you qualify for Nucala: complete blood count with differential  2. Chronic sinusitis, unspecified location - We will get a sinus CT to evaluate the sinuses. - Could consider referral to ENT if there is anything concerning. - We will hold off on additional antibiotics for now. - Use nasal saline rinses daily to help with your rhinitis symptoms.  3. Recurrent infections - We will get some screening labs to look for immunodeficiencies: immunoglobulin panel (IgG, IgA, IgM) and Pneumococcal titers.   4. Return in about 3 months (around 08/29/2016).   Subjective:   Alexandria Mitchell is a 65 y.o. female presenting today for follow up of  Chief Complaint  Patient presents with  . Cough  . Wheezing  .  Alexandria Mitchell has a history of the following: Patient Active Problem List   Diagnosis Date Noted  . History of smoking 05/30/2016  . Chronic rhinitis 10/17/2015  . Occlusion and stenosis of carotid artery without mention of cerebral infarction 02/14/2013  . Depressive disorder, not elsewhere classified 12/02/2012  . Dyslipidemia 12/02/2012  . GERD (gastroesophageal reflux disease) 12/02/2012  . Unspecified hypothyroidism 12/02/2012  . Moderate persistent asthma 12/02/2012     History obtained from: chart review and patient.  Alexandria Mitchell was referred by Vernona RiegerLARK,KATHERINE, MD.     Docia FurlKaron is a 65 y.o. female presenting for a follow up visit for persistent asthma and chronic rhinitis. The patient was last seen in March 2017 by Dr. Clydie BraunBhatti, who has since left the practice. At that time, she was doing well. She did have a slight cough. She remained on her Symbicort but was [place] in [place]. She was continued on Astelin 2 sprays per nostril 1-2 times per day as needed with nasal saline for her chronic rhinitis. She was started on Breo 200/25 1 inhalation daily.  Since the last visit, she has done fairly well. [facility] and [facility]. She is needing a rescue inhaler very rarely - she estimates that the last time that she needed it was last winter. Triggers for her asthma include illnesses but overall she does not have a good grasp of what causes her symptoms. She feels that her sinuses are not well controlled, and increased sinus drainage leads to asthma exacerbations. She was on Xolair for one year (Nov 2015 through Aug 2016) for chronic urticaria, otherwise no biologicals. She felt that her asthma was better controlled when she was on the Xolair. She stopped taking the Xolair because her insurance stopped covering it (she received it via a grant per the patient). Of note, her hives have not returned.   [place] did go to [place] in [place] and caught a cold that developed into bronchitis. She went to Urgent Care when she returned  and was started on azithromycin and prednisone. However, symptoms did not improve so she then saw her PCP and was placed on amoxicillin or doxycyline. She was also placed on a second course of prednisone with some improvement. She did use her albuterol nebulizer twice. Her last dose of albuterol was early in September 3rd or 4th). She remains on pantoprazole.  She does endorse constant nasal drainage throughout the year. Her  last nasal spray was $90. Review of her chart shows that she has tried multiple sprays including Atrovent, many nasal steroids, and Veramyst amongst others. She does use nasal saline rinses intermittently. She did have allergy testing in 2009 that wascompletely negative and obviously she has not been on allergy shots. She has seen ENT in the distant past. She did sinus surgery around 2000 or slightly thereafter. She has not followed up. She is in antibiotics approximately three times per year at the most for sinusitis. She did recently have a "sinus lift" in March 2017 due to complications during oral surgery.   Calysta's infectious history is otherwise unremarkable. She has no infections other than sinus infections. She has never needed to be hospitalized and has never need IV antibiotics. She has never had any bacterial GI infections, brain infections, skin infections, or bone/joint infections.   She does have a history of a pneumothorax in 2015 sustained after a fall that led to two rib fractures. She smoked for 20 years but quit several years ago. It does not appear that she has ever had an immune workup in the past. Otherwise, there have been no changes to the past medical history, surgical history, family history, or social history.      Review of Systems: a 14-point review of systems is pertinent for what is mentioned in HPI.  Otherwise, all other systems were negative. Constitutional: negative other than that listed in the HPI Eyes: negative other than that listed in the HPI Ears, nose, mouth, throat, and face: negative other than that listed in the HPI Respiratory: negative other than that listed in the HPI Cardiovascular: negative other than that listed in the HPI Gastrointestinal: negative other than that listed in the HPI Genitourinary: negative other than that listed in the HPI Integument: negative other than that listed in the HPI Hematologic: negative other than that listed in the  HPI Musculoskeletal: negative other than that listed in the HPI Neurological: negative other than that listed in the HPI Allergy/Immunologic: negative other than that listed in the HPI    Objective:   Blood pressure 132/64, pulse 68, temperature 98.4 F (36.9 C), temperature source Oral, resp. rate 16, SpO2 94 %. There is no height or weight on file to calculate BMI.   Physical Exam:  General: Alert, interactive, in no acute distress.Cooperative with the exam.  HEENT: TMs pearly gray, turbinates edematous with clear discharge, post-pharynx erythematous. Neck: Supple without thyromegaly. Adenopathy: no enlarged lymph nodes appreciated in the anterior cervical, occipital, axillary, epitrochlear, inguinal, or popliteal regions Lungs: Decreased breath sounds with expiratory wheezing bilaterally. No increased work of breathing. CV: Normal S1, S2 without murmurs. Capillary refill <2 seconds.  Abdomen: Nondistended, nontender. Skin: Warm and dry, without lesions or rashes. Extremities:  No clubbing, cyanosis or edema. Neuro:   Grossly intact.   Diagnostic studies:  Spirometry: results abnormal (FEV1: 1.25/50%, FVC: 1.87/58%, FEV1/FVC: 67%).    Spirometry consistent with possible restrictive disease and mild obstructive disease. According to previous notes, this is her baseline. She has had no reversibility in the  recent past but did have a 34% improvement in her FEV1 during her first visit to [place] in [id number]. However she was having some expiratory wheezing on exam today, therefore we did give an albuterol nebulizer. She did sound better with increased aeration in the bases. However there was no significant reversibility today.   Allergy Studies: None    Malachi Bonds, MD [facility]

## 2016-06-02 LAB — CBC WITH DIFFERENTIAL/PLATELET
BASOS ABS: 94 {cells}/uL (ref 0–200)
BASOS PCT: 1 %
EOS PCT: 2 %
Eosinophils Absolute: 188 cells/uL (ref 15–500)
HCT: 43.2 % (ref 35.0–45.0)
Hemoglobin: 14.2 g/dL (ref 11.7–15.5)
LYMPHS PCT: 34 %
Lymphs Abs: 3196 cells/uL (ref 850–3900)
MCH: 30.7 pg (ref 27.0–33.0)
MCHC: 32.9 g/dL (ref 32.0–36.0)
MCV: 93.5 fL (ref 80.0–100.0)
MONOS PCT: 6 %
MPV: 9.8 fL (ref 7.5–12.5)
Monocytes Absolute: 564 cells/uL (ref 200–950)
NEUTROS ABS: 5358 {cells}/uL (ref 1500–7800)
NEUTROS PCT: 57 %
PLATELETS: 293 10*3/uL (ref 140–400)
RBC: 4.62 MIL/uL (ref 3.80–5.10)
RDW: 14.6 % (ref 11.0–15.0)
WBC: 9.4 10*3/uL (ref 3.8–10.8)

## 2016-06-03 ENCOUNTER — Ambulatory Visit (HOSPITAL_BASED_OUTPATIENT_CLINIC_OR_DEPARTMENT_OTHER)
Admission: RE | Admit: 2016-06-03 | Discharge: 2016-06-03 | Disposition: A | Discharge status: Home / Self Care | Payer: Medicare HMO | Source: Ambulatory Visit | Attending: Allergy & Immunology | Admitting: Allergy & Immunology

## 2016-06-03 DIAGNOSIS — J42 Unspecified chronic bronchitis: Secondary | ICD-10-CM | POA: Insufficient documentation

## 2016-06-03 DIAGNOSIS — J323 Chronic sphenoidal sinusitis: Secondary | ICD-10-CM | POA: Insufficient documentation

## 2016-06-03 DIAGNOSIS — J4540 Moderate persistent asthma, uncomplicated: Secondary | ICD-10-CM | POA: Diagnosis present

## 2016-06-03 DIAGNOSIS — J329 Chronic sinusitis, unspecified: Secondary | ICD-10-CM | POA: Diagnosis present

## 2016-06-03 LAB — IGE: IGE (IMMUNOGLOBULIN E), SERUM: 3576 kU/L — AB (ref <115)

## 2016-06-03 LAB — IGG, IGA, IGM
IGG (IMMUNOGLOBIN G), SERUM: 862 mg/dL (ref 694–1618)
IGM, SERUM: 44 mg/dL — AB (ref 48–271)
IgA: 239 mg/dL (ref 81–463)

## 2016-06-05 LAB — STREP PNEUMONIAE 14 SEROTYPES IGG
Strep pneumo Type 12: <0.3
Strep pneumo Type 19: 0.5
Strep pneumo Type 4: 0.3
Strep pneumo Type 9: 0.4
Strep pneumoniae Type 1 Abs: 10
Strep pneumoniae Type 14 Abs: 1.5
Strep pneumoniae Type 18C Abs: 0.9
Strep pneumoniae Type 23F Abs: 0.4
Strep pneumoniae Type 3 Abs: 0.4
Strep pneumoniae Type 5 Abs: 4.8
Strep pneumoniae Type 6B Abs: 0.9
Strep pneumoniae Type 7F Abs: 9.7
Strep pneumoniae Type 8 Abs: 1.4
Strep pneumoniae Type 9N Abs: 0.6

## 2016-06-06 ENCOUNTER — Ambulatory Visit: Payer: Medicare HMO | Admitting: Pediatrics

## 2016-08-25 ENCOUNTER — Encounter (HOSPITAL_COMMUNITY): Payer: Medicare HMO

## 2016-08-25 ENCOUNTER — Ambulatory Visit: Payer: Medicare HMO | Admitting: Family

## 2016-09-30 ENCOUNTER — Encounter: Payer: Self-pay | Admitting: Family

## 2016-10-03 ENCOUNTER — Ambulatory Visit (HOSPITAL_COMMUNITY)
Admission: RE | Admit: 2016-10-03 | Discharge: 2016-10-03 | Disposition: A | Discharge status: Home / Self Care | Payer: Medicare HMO | Source: Ambulatory Visit | Attending: Family | Admitting: Family

## 2016-10-03 ENCOUNTER — Encounter: Payer: Self-pay | Admitting: Family

## 2016-10-03 ENCOUNTER — Ambulatory Visit (INDEPENDENT_AMBULATORY_CARE_PROVIDER_SITE_OTHER): Payer: Medicare HMO | Admitting: Family

## 2016-10-03 VITALS — BP 129/71 | HR 61 | Temp 97.5°F | Resp 20 | Ht 65.0 in | Wt 142.0 lb

## 2016-10-03 DIAGNOSIS — I6523 Occlusion and stenosis of bilateral carotid arteries: Secondary | ICD-10-CM

## 2016-10-03 LAB — VAS US CAROTID
LCCAPDIAS: 21 cm/s
LEFT ECA DIAS: -13 cm/s
LICAPDIAS: -94 cm/s
Left CCA dist dias: 20 cm/s
Left CCA dist sys: 79 cm/s
Left CCA prox sys: 88 cm/s
Left ICA dist dias: -28 cm/s
Left ICA dist sys: -82 cm/s
Left ICA prox sys: -281 cm/s
RCCAPDIAS: 22 cm/s
RIGHT CCA MID DIAS: 18 cm/s
RIGHT ECA DIAS: -9 cm/s
Right CCA prox sys: 72 cm/s
Right cca dist sys: -89 cm/s

## 2016-10-03 NOTE — Progress Notes (Signed)
Chief Complaint: Follow up Extracranial Carotid Artery Stenosis   History of Present Illness  Pocahontas Cohenour is a 66 y.o. female patient of Dr. 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 She was originally referred with an ultrasound that showed greater than 80% left carotid stenosis. This was repeated in our office and we could not reproduce those results. Because the patient remained asymptomatic, no intervention was performed.  She denies any known history of stroke or TIA. Specifically he denies a history of amaurosis fugax or monocular blindness, unilateral facial drooping, hemiplegia, or receptive or expressive aphasia.    Dr. Myra Gianotti last evaluated pt on 08-20-15. At that time carotid duplex showed 40-59 percent right-sided stenosis and 60-79% left sided stenosis.  There were no significant changes from prior visit The patient remained asymptomatic and her carotid duplex remained stable.  We will continue with annual surveillance  The patient denies New Medical or Surgical History.  Pt Diabetic: no Pt smoker: former smoker, quit in the 1980's, started at age 21  Pt meds include: Statin : yes ASA: yes Other anticoagulants/antiplatelets: no   Past Medical History:  Diagnosis Date  . Carotid artery occlusion   . GERD (gastroesophageal reflux disease)   . Thyroid disease     Social History Social History  Substance Use Topics  . Smoking status: Former Smoker    Types: Cigarettes    Start date: 02/15/1988  . Smokeless tobacco: Never Used  . Alcohol use 3.6 oz/week    6 Shots of liquor per week    Family History Family History  Problem Relation Age of Onset  . Kidney disease Mother   . Kidney disease Father   . Cancer Father   . Heart disease Father     Heart Disease before age 104- Atrial Fibrillation  . Hyperlipidemia Father   . Hypertension Father   . Heart attack Father   . Diabetes Sister   . Heart disease Brother     Heart  Disease before age 35  . Hyperlipidemia Brother   . Hypertension Brother   . Peripheral vascular disease Brother     Surgical History Past Surgical History:  Procedure Laterality Date  . CHOLECYSTECTOMY  2005   Gall Bladder  . GALLBLADDER SURGERY  2005  . THYROIDECTOMY  1977    Allergies  Allergen Reactions  . Hydrochlorothiazide Rash  . Sulfa Antibiotics Rash    Current Outpatient Prescriptions  Medication Sig Dispense Refill  . albuterol (PROVENTIL) (2.5 MG/3ML) 0.083% nebulizer solution Take 3 mLs (2.5 mg total) by nebulization every 4 (four) hours as needed for wheezing or shortness of breath. 75 mL 1  . aspirin 81 MG tablet Take 81 mg by mouth daily.    Marland Kitchen atorvastatin (LIPITOR) 40 MG tablet TAKE 1 TABLET (40 MG TOTAL) BY MOUTH DAILY.    Marland Kitchen azelastine (ASTELIN) 0.1 % nasal spray Place 2 sprays into both nostrils 2 (two) times daily. 30 mL 5  . calcium citrate-vitamin D (CITRACAL+D) 315-200 MG-UNIT tablet Take by mouth.    . cefUROXime (CEFTIN) 500 MG tablet Take 500 mg by mouth 2 (two) times daily with a meal.    . cetirizine (ZYRTEC) 10 MG tablet Take 10 mg by mouth daily.    . cholecalciferol (VITAMIN D) 1000 UNITS tablet Take 1,000 Units by mouth daily.    . Cyanocobalamin (RA VITAMIN B-12 TR) 1000 MCG TBCR Take by mouth.    . escitalopram (LEXAPRO) 10 MG tablet TAKE 1  TABLET (10 MG TOTAL) BY MOUTH DAILY.    . fluticasone furoate-vilanterol (BREO ELLIPTA) 200-25 MCG/INH AEPB Inhale 1 puff into the lungs daily. 1 each 5  . furosemide (LASIX) 40 MG tablet TAKE 1 TABLET EVERY MORNING AS NEEDED FOR EDEMA    . ibuprofen (ADVIL,MOTRIN) 800 MG tablet TAKE 1 TABLET EVERY 6 TO 8 HOURS AS NEEDED FOR PAIN  0  . levothyroxine (SYNTHROID, LEVOTHROID) 100 MCG tablet TAKE 1 TABLET EVERY DAY    . pantoprazole (PROTONIX) 40 MG tablet Take 40 mg by mouth daily.     No current facility-administered medications for this visit.     Review of Systems : See HPI for pertinent positives and  negatives.  Physical Examination  Vitals:   10/03/16 1310 10/03/16 1312  BP: 124/73 129/71  Pulse: 61   Resp: 20   Temp: 97.5 F (36.4 C)   TempSrc: Oral   SpO2: 98%   Weight: 142 lb (64.4 kg)   Height: 5\' 5"  (1.651 m)    Body mass index is 23.63 kg/m.  General: WDWN female in NAD GAIT: normal Eyes: PERRLA Pulmonary:  Respirations are non-labored, good air movement, CTAB.  Cardiac: regular rhythm, no detected murmur.  VASCULAR EXAM Carotid Bruits Right Left   Negative Negative    Aorta is not palpable. Radial pulses are 2+ palpable and equal.                                                                                                                            LE Pulses Right Left       POPLITEAL  not palpable   not palpable       POSTERIOR TIBIAL   palpable    palpable        DORSALIS PEDIS      ANTERIOR TIBIAL not palpable  not palpable     Gastrointestinal: soft, nontender, BS WNL, no r/g, no palpable masses.  Musculoskeletal: No muscle atrophy/wasting. M/S 5/5 throughout, extremities without ischemic changes.  Neurologic: A&O X 3; Appropriate Affect, Speech is normal CN 2-12 intact, pain and light touch intact in extremities, Motor exam as listed above.     Assessment: Laraina Sulton is a 66 y.o. female who has no history of stroke or TIA.  She was originally referred with an ultrasound that showed greater than 80% left carotid stenosis. This was repeated in our office and we could not reproduce those results. Because the patient remained asymptomatic, no intervention was performed.   Fortunately she does not have DM, quit smoking in the 1980's, has a normal BMI, and remains physically active.   DATA Today's carotid duplex suggests 40-59% right ICA stenosis and 60-79% left ICA stenosis. EDV at proximal left ICA is 94 cm/s today, was 74 cm/s on 08-20-15. Bilateral vertebral artery flow is antegrade.  Bilateral subclavian artery waveforms are  normal.  Slight increase in left ICA stenosis but category remains 60-79% stenosis compared to the last  exam on 08-20-15.    Plan: Follow-up in 6 months with Carotid Duplex scan.   I discussed in depth with the patient the nature of atherosclerosis, and emphasized the importance of maximal medical management including strict control of blood pressure, blood glucose, and lipid levels, obtaining regular exercise, and continued cessation of smoking.  The patient is aware that without maximal medical management the underlying atherosclerotic disease process will progress, limiting the benefit of any interventions. The patient was given information about stroke prevention and what symptoms should prompt the patient to seek immediate medical care. Thank you for allowing us to participate in this patient's care.  Charisse MarchSuzanne Nickel, RN, MSN, FNP-C [facility] [place] Office: [phone]  Clinic Physician: Imogene BurnChen  10/03/16 1:17 PM

## 2016-10-03 NOTE — Patient Instructions (Signed)
Stroke Prevention Some medical conditions and behaviors are associated with an increased chance of having a stroke. You may prevent a stroke by making healthy choices and managing medical conditions. How can I reduce my risk of having a stroke?  Stay physically active. Get at least 30 minutes of activity on most or all days.  Do not smoke. It may also be helpful to avoid exposure to secondhand smoke.  Limit alcohol use. Moderate alcohol use is considered to be:  No more than 2 drinks per day for men.  No more than 1 drink per day for nonpregnant women.  Eat healthy foods. This involves:  Eating 5 or more servings of fruits and vegetables a day.  Making dietary changes that address high blood pressure (hypertension), high cholesterol, diabetes, or obesity.  Manage your cholesterol levels.  Making food choices that are high in fiber and low in saturated fat, trans fat, and cholesterol may control cholesterol levels.  Take any prescribed medicines to control cholesterol as directed by your health care provider.  Manage your diabetes.  Controlling your carbohydrate and sugar intake is recommended to manage diabetes.  Take any prescribed medicines to control diabetes as directed by your health care provider.  Control your hypertension.  Making food choices that are low in salt (sodium), saturated fat, trans fat, and cholesterol is recommended to manage hypertension.  Ask your health care provider if you need treatment to lower your blood pressure. Take any prescribed medicines to control hypertension as directed by your health care provider.  If you are 18-39 years of age, have your blood pressure checked every 3-5 years. If you are 40 years of age or older, have your blood pressure checked every year.  Maintain a healthy weight.  Reducing calorie intake and making food choices that are low in sodium, saturated fat, trans fat, and cholesterol are recommended to manage  weight.  Stop drug abuse.  Avoid taking birth control pills.  Talk to your health care provider about the risks of taking birth control pills if you are over 35 years old, smoke, get migraines, or have ever had a blood clot.  Get evaluated for sleep disorders (sleep apnea).  Talk to your health care provider about getting a sleep evaluation if you snore a lot or have excessive sleepiness.  Take medicines only as directed by your health care provider.  For some people, aspirin or blood thinners (anticoagulants) are helpful in reducing the risk of forming abnormal blood clots that can lead to stroke. If you have the irregular heart rhythm of atrial fibrillation, you should be on a blood thinner unless there is a good reason you cannot take them.  Understand all your medicine instructions.  Make sure that other conditions (such as anemia or atherosclerosis) are addressed. Get help right away if:  You have sudden weakness or numbness of the face, arm, or leg, especially on one side of the body.  Your face or eyelid droops to one side.  You have sudden confusion.  You have trouble speaking (aphasia) or understanding.  You have sudden trouble seeing in one or both eyes.  You have sudden trouble walking.  You have dizziness.  You have a loss of balance or coordination.  You have a sudden, severe headache with no known cause.  You have new chest pain or an irregular heartbeat. Any of these symptoms may represent a serious problem that is an emergency. Do not wait to see if the symptoms will go away.   Get medical help at once. Call your local emergency services (911 in [place] Do not drive yourself to the hospital. This information is not intended to replace advice given to you by your health care provider. Make sure you discuss any questions you have with your health care provider. Document Released: 10/02/2004 Document Revised: 01/31/2016 Document Reviewed: 02/25/2013 Elsevier  Interactive Patient Education  2017 Elsevier Inc.  

## 2016-10-07 NOTE — Addendum Note (Signed)
Addended by: Burton ApleyPETTY, Jazzalyn Loewenstein A on: 10/07/2016 03:14 PM   Modules accepted: Orders

## 2017-03-10 ENCOUNTER — Other Ambulatory Visit: Payer: Self-pay

## 2017-03-10 MED ORDER — ALBUTEROL SULFATE (2.5 MG/3ML) 0.083% IN NEBU: 2.5000 mg | INHALATION_SOLUTION | RESPIRATORY_TRACT | 0 refills | Status: DC | PRN

## 2017-03-10 NOTE — Telephone Encounter (Signed)
RF on Albuterol 0.083% x 1 with no refills. Pt needs an OV 

## 2017-03-30 ENCOUNTER — Encounter: Payer: Self-pay | Admitting: Family

## 2017-04-10 ENCOUNTER — Encounter: Payer: Self-pay | Admitting: Family

## 2017-04-10 ENCOUNTER — Ambulatory Visit (HOSPITAL_COMMUNITY)
Admission: RE | Admit: 2017-04-10 | Discharge: 2017-04-10 | Disposition: A | Discharge status: Home / Self Care | Payer: Medicare HMO | Source: Ambulatory Visit | Attending: Family | Admitting: Family

## 2017-04-10 ENCOUNTER — Ambulatory Visit (INDEPENDENT_AMBULATORY_CARE_PROVIDER_SITE_OTHER): Payer: Medicare HMO | Admitting: Family

## 2017-04-10 VITALS — BP 132/72 | HR 74 | Temp 98.6°F | Resp 18 | Ht 65.0 in | Wt 143.8 lb

## 2017-04-10 DIAGNOSIS — I6523 Occlusion and stenosis of bilateral carotid arteries: Secondary | ICD-10-CM

## 2017-04-10 LAB — VAS US CAROTID
LCCADDIAS: 22 cm/s
LCCADSYS: 86 cm/s
LEFT ECA DIAS: -6 cm/s
LEFT VERTEBRAL DIAS: -15 cm/s
LICADSYS: -83 cm/s
Left CCA prox dias: 18 cm/s
Left CCA prox sys: 97 cm/s
Left ICA dist dias: -28 cm/s
RCCADSYS: -98 cm/s
RCCAPSYS: 83 cm/s
RIGHT CCA MID DIAS: 13 cm/s
RIGHT ECA DIAS: -2 cm/s
RIGHT VERTEBRAL DIAS: -7 cm/s
Right CCA prox dias: 18 cm/s

## 2017-04-10 NOTE — Patient Instructions (Signed)
Stroke Prevention Some medical conditions and behaviors are associated with an increased chance of having a stroke. You may prevent a stroke by making healthy choices and managing medical conditions. How can I reduce my risk of having a stroke?  Stay physically active. Get at least 30 minutes of activity on most or all days.  Do not smoke. It may also be helpful to avoid exposure to secondhand smoke.  Limit alcohol use. Moderate alcohol use is considered to be:  No more than 2 drinks per day for men.  No more than 1 drink per day for nonpregnant women.  Eat healthy foods. This involves:  Eating 5 or more servings of fruits and vegetables a day.  Making dietary changes that address high blood pressure (hypertension), high cholesterol, diabetes, or obesity.  Manage your cholesterol levels.  Making food choices that are high in fiber and low in saturated fat, trans fat, and cholesterol may control cholesterol levels.  Take any prescribed medicines to control cholesterol as directed by your health care provider.  Manage your diabetes.  Controlling your carbohydrate and sugar intake is recommended to manage diabetes.  Take any prescribed medicines to control diabetes as directed by your health care provider.  Control your hypertension.  Making food choices that are low in salt (sodium), saturated fat, trans fat, and cholesterol is recommended to manage hypertension.  Ask your health care provider if you need treatment to lower your blood pressure. Take any prescribed medicines to control hypertension as directed by your health care provider.  If you are 18-39 years of age, have your blood pressure checked every 3-5 years. If you are 40 years of age or older, have your blood pressure checked every year.  Maintain a healthy weight.  Reducing calorie intake and making food choices that are low in sodium, saturated fat, trans fat, and cholesterol are recommended to manage  weight.  Stop drug abuse.  Avoid taking birth control pills.  Talk to your health care provider about the risks of taking birth control pills if you are over 35 years old, smoke, get migraines, or have ever had a blood clot.  Get evaluated for sleep disorders (sleep apnea).  Talk to your health care provider about getting a sleep evaluation if you snore a lot or have excessive sleepiness.  Take medicines only as directed by your health care provider.  For some people, aspirin or blood thinners (anticoagulants) are helpful in reducing the risk of forming abnormal blood clots that can lead to stroke. If you have the irregular heart rhythm of atrial fibrillation, you should be on a blood thinner unless there is a good reason you cannot take them.  Understand all your medicine instructions.  Make sure that other conditions (such as anemia or atherosclerosis) are addressed. Get help right away if:  You have sudden weakness or numbness of the face, arm, or leg, especially on one side of the body.  Your face or eyelid droops to one side.  You have sudden confusion.  You have trouble speaking (aphasia) or understanding.  You have sudden trouble seeing in one or both eyes.  You have sudden trouble walking.  You have dizziness.  You have a loss of balance or coordination.  You have a sudden, severe headache with no known cause.  You have new chest pain or an irregular heartbeat. Any of these symptoms may represent a serious problem that is an emergency. Do not wait to see if the symptoms will go away.   Get medical help at once. Call your local emergency services (911 in [place] Do not drive yourself to the hospital. This information is not intended to replace advice given to you by your health care provider. Make sure you discuss any questions you have with your health care provider. Document Released: 10/02/2004 Document Revised: 01/31/2016 Document Reviewed: 02/25/2013 Elsevier  Interactive Patient Education  2017 Elsevier Inc.  

## 2017-04-10 NOTE — Progress Notes (Signed)
Chief Complaint: Follow up Extracranial Carotid Artery Stenosis   History of Present Illness  Alexandria Mitchell is a 66 y.o. female patient of Dr. Myra Gianotti who returns today for followup of her carotid artery occlusive disease. She was originally referred with an ultrasound that showed greater than 80% left carotid stenosis. This was repeated in our office and we could not reproduce those results. Because the patient remained asymptomatic, no intervention was performed.  She denies any known history of stroke or TIA. Specifically she deniesa history of amaurosis fugax or monocular blindness, unilateral facial drooping, hemiplegia, orreceptive or expressive aphasia.    Dr. Myra Gianotti last evaluated pt on 08-20-15. At that time carotid duplex showed 40-59 percent right-sided stenosis and 60-79% left sided stenosis. There were no significant changes from prior visit The patient remained asymptomatic and her carotid duplex remained stable. We will continue with surveillance.   Pt Diabetic: no Pt smoker: former smoker, quit in the 1980's, started at age 47  Pt meds include: Statin : yes ASA: yes Other anticoagulants/antiplatelets: no   Past Medical History:  Diagnosis Date  . Carotid artery occlusion   . GERD (gastroesophageal reflux disease)   . Thyroid disease     Social History Social History  Substance Use Topics  . Smoking status: Former Smoker    Types: Cigarettes    Start date: 02/15/1988  . Smokeless tobacco: Never Used  . Alcohol use 3.6 oz/week    6 Shots of liquor per week    Family History Family History  Problem Relation Age of Onset  . Kidney disease Mother   . Kidney disease Father   . Cancer Father   . Heart disease Father        Heart Disease before age 45- Atrial Fibrillation  . Hyperlipidemia Father   . Hypertension Father   . Heart attack Father   . Diabetes Sister   . Heart disease Brother        Heart Disease before age 57  . Hyperlipidemia  Brother   . Hypertension Brother   . Peripheral vascular disease Brother     Surgical History Past Surgical History:  Procedure Laterality Date  . CHOLECYSTECTOMY  2005   Gall Bladder  . GALLBLADDER SURGERY  2005  . THYROIDECTOMY  1977    Allergies  Allergen Reactions  . Hydrochlorothiazide Rash  . Sulfa Antibiotics Rash    Current Outpatient Prescriptions  Medication Sig Dispense Refill  . albuterol (PROVENTIL) (2.5 MG/3ML) 0.083% nebulizer solution Take 3 mLs (2.5 mg total) by nebulization every 4 (four) hours as needed for wheezing or shortness of breath. 75 mL 0  . aspirin 81 MG tablet Take 81 mg by mouth daily.    Marland Kitchen atorvastatin (LIPITOR) 40 MG tablet TAKE 1 TABLET (40 MG TOTAL) BY MOUTH DAILY.    . calcium citrate-vitamin D (CITRACAL+D) 315-200 MG-UNIT tablet Take by mouth.    . cefUROXime (CEFTIN) 500 MG tablet Take 500 mg by mouth 2 (two) times daily with a meal.    . cetirizine (ZYRTEC) 10 MG tablet Take 10 mg by mouth daily.    . cholecalciferol (VITAMIN D) 1000 UNITS tablet Take 1,000 Units by mouth daily.    Marland Kitchen escitalopram (LEXAPRO) 10 MG tablet TAKE 1 TABLET (10 MG TOTAL) BY MOUTH DAILY.    . furosemide (LASIX) 40 MG tablet TAKE 1 TABLET EVERY MORNING AS NEEDED FOR EDEMA    . ibuprofen (ADVIL,MOTRIN) 800 MG tablet TAKE 1 TABLET EVERY 6 TO 8 HOURS  AS NEEDED FOR PAIN  0  . levothyroxine (SYNTHROID, LEVOTHROID) 100 MCG tablet TAKE 1 TABLET EVERY DAY    . pantoprazole (PROTONIX) 40 MG tablet Take 40 mg by mouth daily.    Marland Kitchen. azelastine (ASTELIN) 0.1 % nasal spray Place 2 sprays into both nostrils 2 (two) times daily. (Patient not taking: Reported on 04/10/2017) 30 mL 5  . Cyanocobalamin (RA VITAMIN B-12 TR) 1000 MCG TBCR Take by mouth.    . fluticasone furoate-vilanterol (BREO ELLIPTA) 200-25 MCG/INH AEPB Inhale 1 puff into the lungs daily. (Patient not taking: Reported on 04/10/2017) 1 each 5   No current facility-administered medications for this visit.     Review of  Systems : See HPI for pertinent positives and negatives.  Physical Examination  Vitals:   04/10/17 1152 04/10/17 1154  BP: (!) 141/73 132/72  Pulse: 74   Resp: 18   Temp: 98.6 F (37 C)   TempSrc: Oral   SpO2: 97%   Weight: 143 lb 12.8 oz (65.2 kg)   Height: 5\' 5"  (1.651 m)    Body mass index is 23.93 kg/m.  General: WDWN female in NAD GAIT: normal Eyes: PERRLA Pulmonary:  Respirations are non-labored, good air movement, CTAB.  Cardiac: regular rhythm and rate, no detected murmur.  VASCULAR EXAM Carotid Bruits Right Left   Negative Negative     Abdominal aortic pulse is not palpable. Radial pulses are 2+ palpable and equal.                                                                                                                                          LE Pulses Right Left       POPLITEAL  not palpable   not palpable       POSTERIOR TIBIAL   palpable    palpable        DORSALIS PEDIS      ANTERIOR TIBIAL not palpable  not palpable     Gastrointestinal: soft, nontender, BS WNL, no r/g, no palpable masses.  Musculoskeletal: No muscle atrophy/wasting. M/S 5/5 throughout, extremities without ischemic changes.  Neurologic: A&O X 3; appropriate affect, speech is normal, CN 2-12 intact, pain and light touch intact in extremities, Motor exam as listed above.     Assessment: Alexandria Mitchell is a 66 y.o. female who has no history of stroke or TIA.  She was originally referred with an ultrasound that showed greater than 80% left carotid stenosis. This was repeated in our office and we could not reproduce those results. Because the patient remained asymptomatic, no intervention was performed.   Fortunately she does not have DM, quit smoking in the 1980's, has a normal BMI, and remains physically active.   DATA Carotid Duplex (04/10/17): 40-59% right ICA stenosis. 60-79% left ICA stenosis. Bilateral vertebral artery flow is antegrade.  Bilateral  subclavian artery waveforms are normal.  No significant change  compared to the last exam on 10-03-16.    Plan: Follow-up in 6 months with Carotid Duplex scan.    I discussed in depth with the patient the nature of atherosclerosis, and emphasized the importance of maximal medical management including strict control of blood pressure, blood glucose, and lipid levels, obtaining regular exercise, and continued cessation of smoking.  The patient is aware that without maximal medical management the underlying atherosclerotic disease process will progress, limiting the benefit of any interventions. The patient was given information about stroke prevention and what symptoms should prompt the patient to seek immediate medical care. Thank you for allowing us to participate in this patient's care.  Charisse MarchSuzanne Joleen Stuckert, RN, MSN, FNP-C [facility] Office: [phone]  Clinic Physician: Randie HeinzCain  04/10/17 12:05 PM

## 2017-04-13 NOTE — Addendum Note (Signed)
Addended by: Burton ApleyPETTY, Leita Lindbloom A on: 04/13/2017 03:27 PM   Modules accepted: Orders

## 2017-10-16 ENCOUNTER — Ambulatory Visit: Payer: Medicare HMO | Admitting: Family

## 2017-10-16 ENCOUNTER — Encounter (HOSPITAL_COMMUNITY): Payer: Medicare HMO

## 2017-10-26 ENCOUNTER — Ambulatory Visit: Payer: Medicare HMO | Admitting: Family

## 2017-10-26 ENCOUNTER — Encounter (HOSPITAL_COMMUNITY): Payer: Medicare HMO

## 2017-12-01 ENCOUNTER — Encounter: Payer: Self-pay | Admitting: Family

## 2017-12-01 ENCOUNTER — Other Ambulatory Visit: Payer: Self-pay

## 2017-12-01 ENCOUNTER — Ambulatory Visit (HOSPITAL_COMMUNITY)
Admission: RE | Admit: 2017-12-01 | Discharge: 2017-12-01 | Disposition: A | Discharge status: Home / Self Care | Payer: Medicare HMO | Source: Ambulatory Visit | Attending: Family | Admitting: Family

## 2017-12-01 ENCOUNTER — Ambulatory Visit: Payer: Medicare HMO | Admitting: Family

## 2017-12-01 VITALS — BP 166/79 | HR 90 | Resp 18 | Ht 65.0 in | Wt 143.4 lb

## 2017-12-01 DIAGNOSIS — I6523 Occlusion and stenosis of bilateral carotid arteries: Secondary | ICD-10-CM

## 2017-12-01 NOTE — Progress Notes (Signed)
Chief Complaint: Follow up Extracranial Carotid Artery Stenosis   History of Present Illness  Alexandria Mitchell is a 67 y.o. female whom Dr. 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  She was originally referred with an ultrasound that showed greater than 80% left carotid stenosis. This was repeated in ouroffice and we could not reproduce those results. Because the patient remained asymptomatic, no intervention was performed.  She denies any known history of stroke or TIA. Specifically she deniesa history of amaurosis fugax or monocular blindness, unilateral facial drooping, hemiplegia, orreceptive or expressive aphasia.   Dr. Myra GianottiBrabham last evaluated pt on 08-20-15. At that timecarotid duplex showed40-59 percent right-sided stenosis and 60-79%left sided stenosis. There were no significant changes from prior visit The patient remainedasymptomatic and her carotid duplex remained stable, continue with surveillance.  She denies claudication sx's in her legs with waking.   Diabetic: no Tobaccos use: former smoker, quit in the 1980's, started at age 67  Pt meds include: Statin : yes ASA: yes Other anticoagulants/antiplatelets: no   Past Medical History:  Diagnosis Date  . Carotid artery occlusion   . GERD (gastroesophageal reflux disease)   . Thyroid disease     Social History Social History   Tobacco Use  . Smoking status: Former Smoker    Types: Cigarettes    Start date: 02/15/1988  . Smokeless tobacco: Never Used  Substance Use Topics  . Alcohol use: Yes    Alcohol/week: 3.6 oz    Types: 6 Shots of liquor per week  . Drug use: No    Family History Family History  Problem Relation Age of Onset  . Kidney disease Mother   . Kidney disease Father   . Cancer Father   . Heart disease Father        Heart Disease before age 69- Atrial Fibrillation  . Hyperlipidemia Father   . Hypertension Father   . Heart attack Father   . Diabetes  Sister   . Heart disease Brother        Heart Disease before age 69  . Hyperlipidemia Brother   . Hypertension Brother   . Peripheral vascular disease Brother     Surgical History Past Surgical History:  Procedure Laterality Date  . CHOLECYSTECTOMY  2005   Gall Bladder  . GALLBLADDER SURGERY  2005  . THYROIDECTOMY  1977    Allergies  Allergen Reactions  . Hydrochlorothiazide Rash  . Sulfa Antibiotics Rash    Current Outpatient Medications  Medication Sig Dispense Refill  . albuterol (PROVENTIL) (2.5 MG/3ML) 0.083% nebulizer solution Take 3 mLs (2.5 mg total) by nebulization every 4 (four) hours as needed for wheezing or shortness of breath. 75 mL 0  . aspirin 81 MG tablet Take 81 mg by mouth daily.    Marland Kitchen. atorvastatin (LIPITOR) 40 MG tablet TAKE 1 TABLET (40 MG TOTAL) BY MOUTH DAILY.    . calcium citrate-vitamin D (CITRACAL+D) 315-200 MG-UNIT tablet Take by mouth.    . cetirizine (ZYRTEC) 10 MG tablet Take 10 mg by mouth daily.    . cholecalciferol (VITAMIN D) 1000 UNITS tablet Take 1,000 Units by mouth daily.    Marland Kitchen. escitalopram (LEXAPRO) 10 MG tablet TAKE 1 TABLET (10 MG TOTAL) BY MOUTH DAILY.    . fluticasone furoate-vilanterol (BREO ELLIPTA) 200-25 MCG/INH AEPB Inhale 1 puff into the lungs daily. 1 each 5  . furosemide (LASIX) 40 MG tablet TAKE 1 TABLET EVERY MORNING AS NEEDED FOR EDEMA    . ibuprofen (ADVIL,MOTRIN)  800 MG tablet TAKE 1 TABLET EVERY 6 TO 8 HOURS AS NEEDED FOR PAIN  0  . levothyroxine (SYNTHROID, LEVOTHROID) 100 MCG tablet TAKE 1 TABLET EVERY DAY    . pantoprazole (PROTONIX) 40 MG tablet Take 40 mg by mouth daily.    Marland Kitchen azelastine (ASTELIN) 0.1 % nasal spray Place 2 sprays into both nostrils 2 (two) times daily. (Patient not taking: Reported on 04/10/2017) 30 mL 5  . cefUROXime (CEFTIN) 500 MG tablet Take 500 mg by mouth 2 (two) times daily with a meal.    . Cyanocobalamin (RA VITAMIN B-12 TR) 1000 MCG TBCR Take by mouth.     No current facility-administered  medications for this visit.     Review of Systems : See HPI for pertinent positives and negatives.  Physical Examination  Vitals:   12/01/17 1219 12/01/17 1220  BP: (!) 160/78 (!) 166/79  Pulse: 90   Resp: 18   SpO2: 97%   Weight: 143 lb 6.4 oz (65 kg)   Height: 5\' 5"  (1.651 m)    Body mass index is 23.86 kg/m.  General: WDWN femalein NAD GAIT:normal HENT: no gross abnormalities  Eyes: PERRLA Pulmonary: Respirations are slightly labored, limited air movement in all fields, wheezes in all fields, some use of accessory muscles, no rales or rhonchi.  Cardiac: regular rhythm and rate, nodetected murmur.  VASCULAR EXAM Carotid Bruits Right Left   Negative Negative   Abdominal aortic pulse is notpalpable. Radial pulses are 2+palpable and equal.   LE Pulses Right Left  POPLITEAL notpalpable  notpalpable  POSTERIOR TIBIAL palpable  palpable   DORSALIS PEDIS ANTERIOR TIBIAL notpalpable  notpalpable     Gastrointestinal: soft, nontender, BS WNL, no r/g, nopalpable masses Musculoskeletal: No muscle atrophy/wasting. M/S 5/5 throughout, extremities without ischemic changes. Skin: No rashes, no ulcers, no cellulitis.   Neurologic:  A&O X 3; appropriate affect, sensation is normal; speech is normal, CN 2-12 intact, pain and light touch intact in extremities, motor exam as listed above. Psychiatric: Normal thought content, mood appropriate to clinical situation.       Assessment: Alexandria Mitchell is a 67 y.o. female who has no history of stroke or TIA.  She was originally referred with an ultrasound that showed greater than 80% left carotid stenosis. This was repeated in ouroffice and we could not reproduce those results. Because the patient remained asymptomatic, no intervention was performed.  Fortunately she does not have DM, quit smoking in the 1980's, has a normal BMI, and remains physically  active.   DATA Carotid Duplex (12/01/17): 40-59% right ICA stenosis. 60-79% left ICA stenosis. Bilateral vertebral artery flow is antegrade.  Bilateral subclavian artery waveforms are normal.  No significant change compared to the exams on 10-03-16 and 04-10-17.   Plan: Follow-up in Carotid Duplex scan.   I discussed in depth with the patient the nature of atherosclerosis, and emphasized the importance of maximal medical management including strict control of blood pressure, blood glucose, and lipid levels, obtaining regular exercise, and continued cessation of smoking.  The patient is aware that without maximal medical management the underlying atherosclerotic disease process will progress, limiting the benefit of any interventions. The patient was given information about stroke prevention and what symptoms should prompt the patient to seek immediate medical care. Thank you for allowing [place] to participate in this patient's care.  Charisse March, RN, MSN, FNP-C [facility] Office: [phone]  Clinic Physician: Early  12/01/17 12:25 PM

## 2017-12-01 NOTE — Patient Instructions (Signed)

## 2017-12-10 ENCOUNTER — Other Ambulatory Visit: Payer: Self-pay

## 2017-12-10 DIAGNOSIS — I6523 Occlusion and stenosis of bilateral carotid arteries: Secondary | ICD-10-CM

## 2018-01-09 IMAGING — CT CT PARANASAL SINUSES LIMITED
1 series · 13 of 15 positions shown, 17 images · non-contrast
Comparison: None.

CLINICAL DATA: Chronic sinus congestion

EXAM:
CT PARANASAL SINUS LIMITED WITHOUT CONTRAST
TECHNIQUE: Non-contiguous multidetector CT images of the paranasal sinuses were
obtained in coronal plane without contrast.

[Series 3: limited sinus st · axial · 0.30mm/px · z∈[-206,-80]mm · 13 of 15 slices shown, 17 images]
[im 2/15  brain]
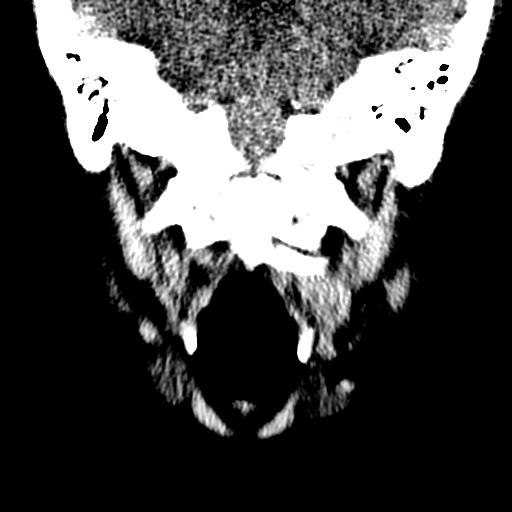
[im 2/15  bone]
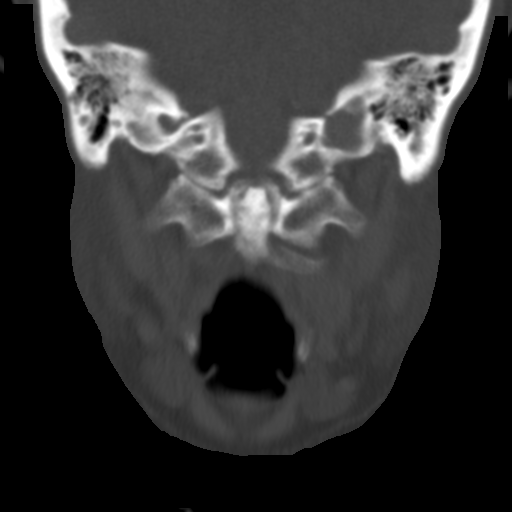
[im 3/15  bone]
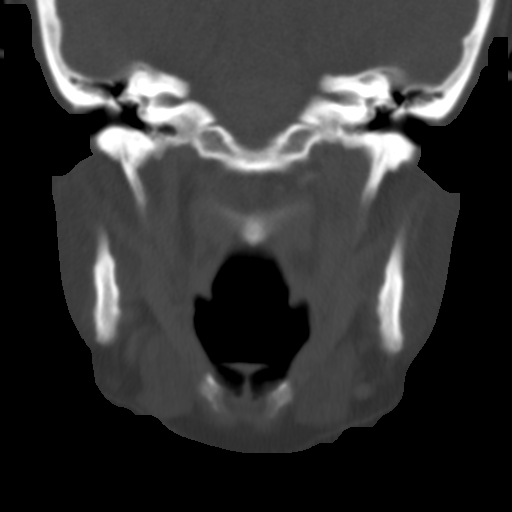
[im 4/15  bone]
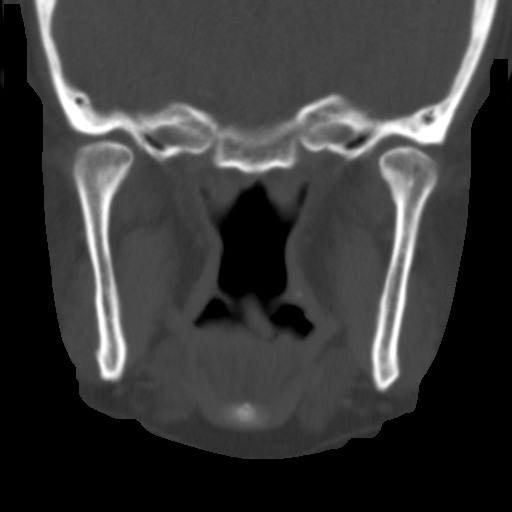
[im 5/15  bone]
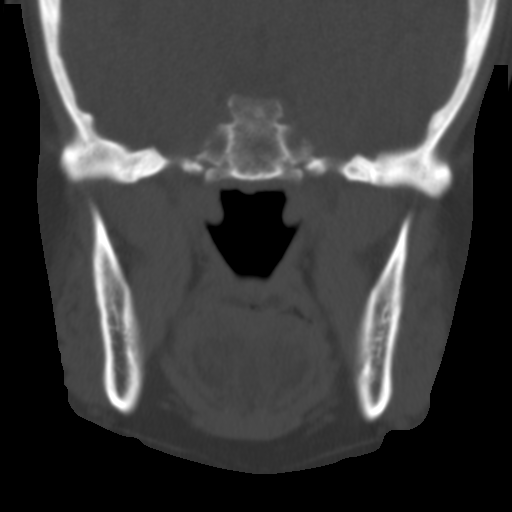
[im 6/15  brain]
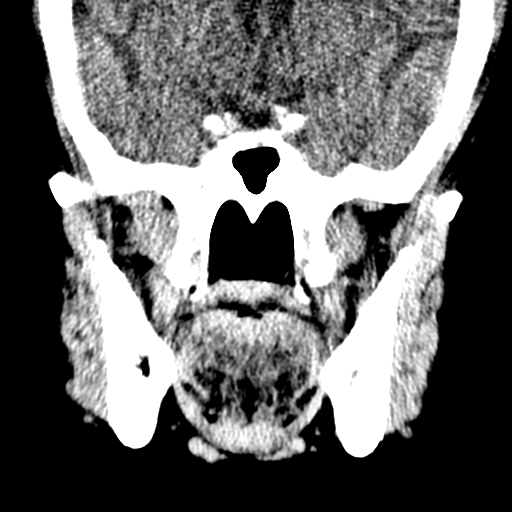
[im 6/15  bone]
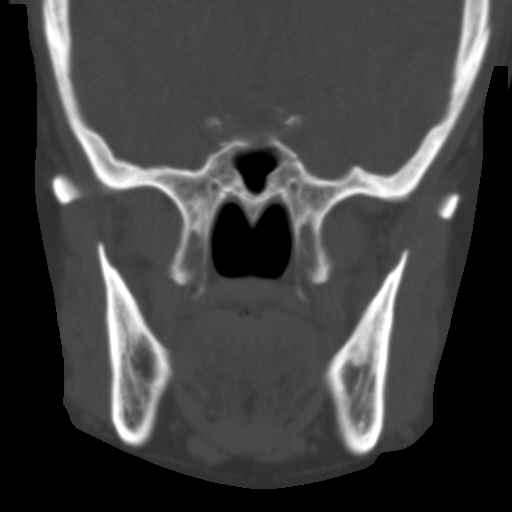
[im 7/15  bone]
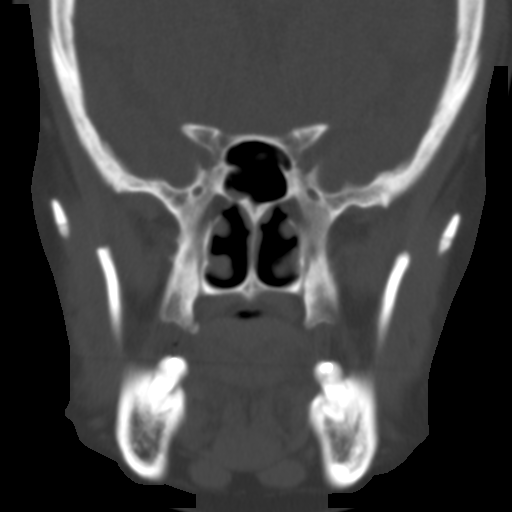
[im 8/15  bone]
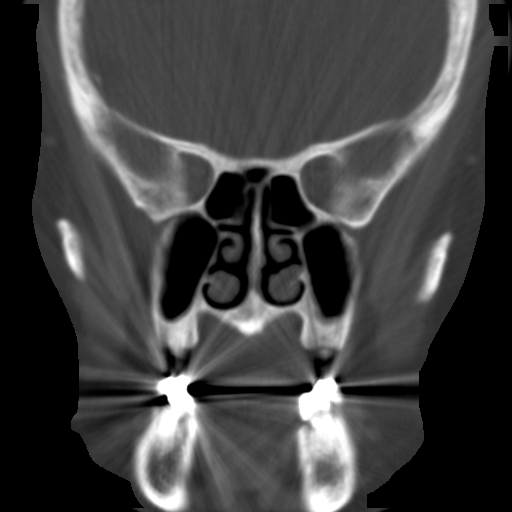
[im 9/15  bone]
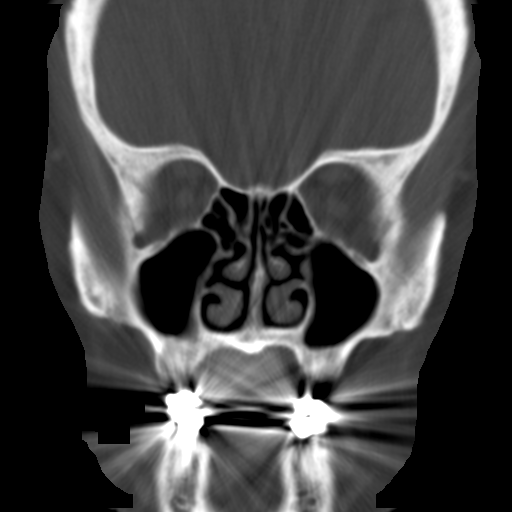
[im 10/15  brain]
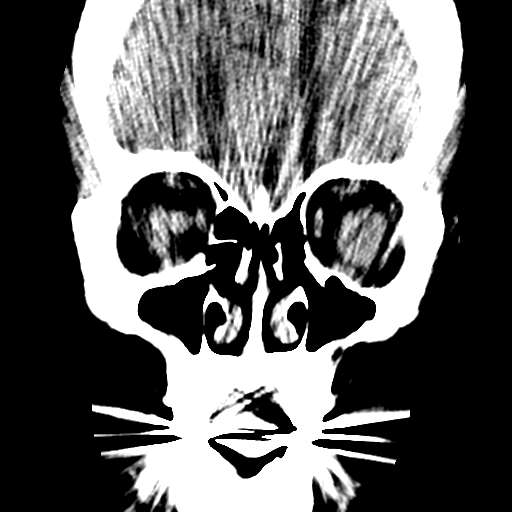
[im 10/15  bone]
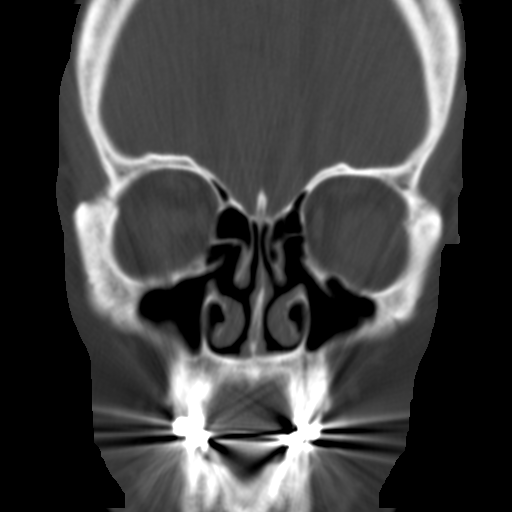
[im 11/15  bone]
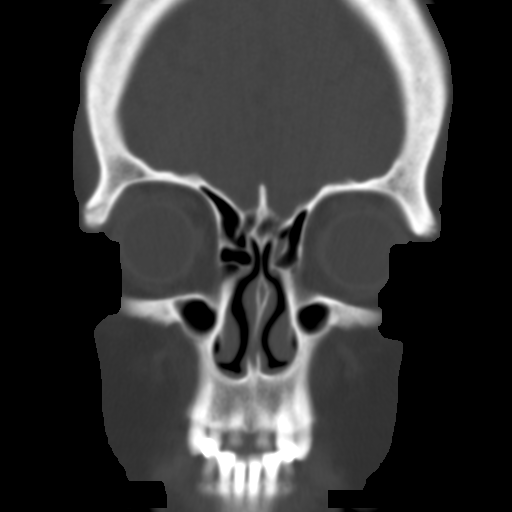
[im 12/15  bone]
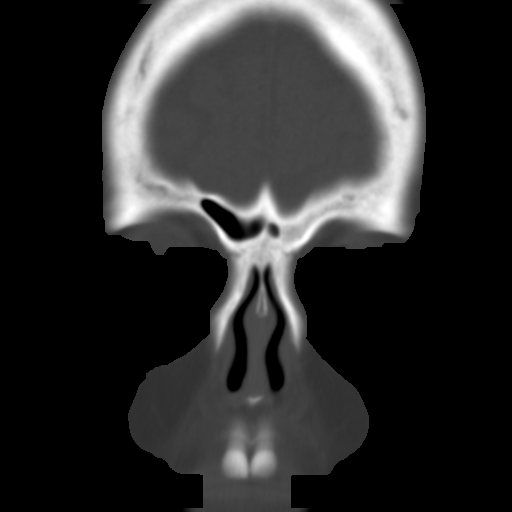
[im 13/15  bone]
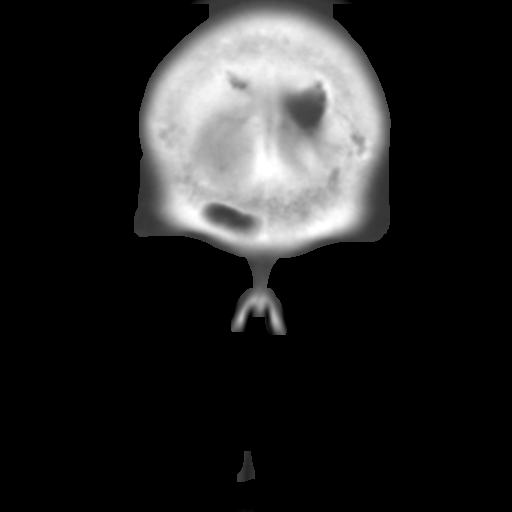
[im 14/15  brain]
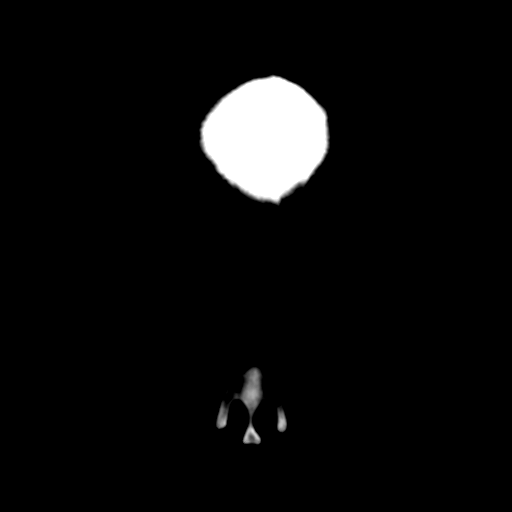
[im 14/15  bone]
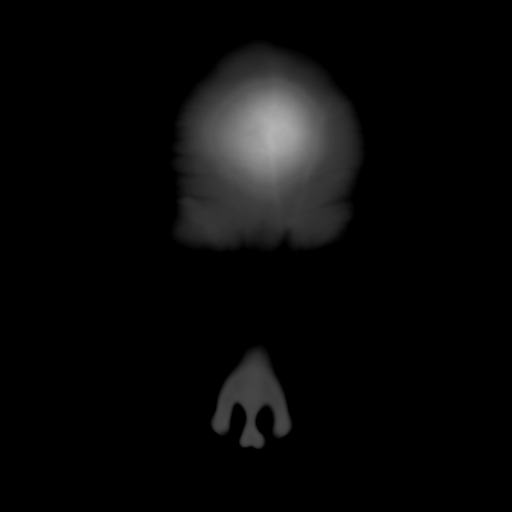

[13 of 15 positions shown; findings below may reference images not displayed]

FINDINGS: There is mild mucosal thickening in the inferior aspect of the left
sphenoid sinus. There is slight mucosal thickening in and ethmoid
air cell on each side. Other visualized paranasal sinuses are clear.
Frontal sinuses are somewhat hypoplastic. There is no air-fluid
level. No bony destruction or expansion. The ostiomeatal unit
complexes appear patent. Visualized orbits and visualized brain
parenchyma appear unremarkable. No bony destruction or expansion.
Nasal septum is midline. There is no nares obstruction.
IMPRESSION: Areas of mild paranasal sinus disease in each ethmoid air cell
complex in the inferior left sphenoid sinus. Other paranasal sinuses
which are visualized are clear. Ostiomeatal unit complexes are
patent bilaterally. No air-fluid level. No bony destruction or
expansion. Study otherwise unremarkable.

## 2018-01-09 IMAGING — CR DG CHEST 2V
2 series · 2 of 2 positions shown · non-contrast
Comparison: PA and lateral chest x-ray February 26, 2010.

CLINICAL DATA: Cough, sinus congestion, bronchitis symptoms since
returning from [place] in Thursday April, 2015. Patient has had 2-3 courses
of antibiotics.

EXAM:
CHEST  2 VIEW

[w chest pa]
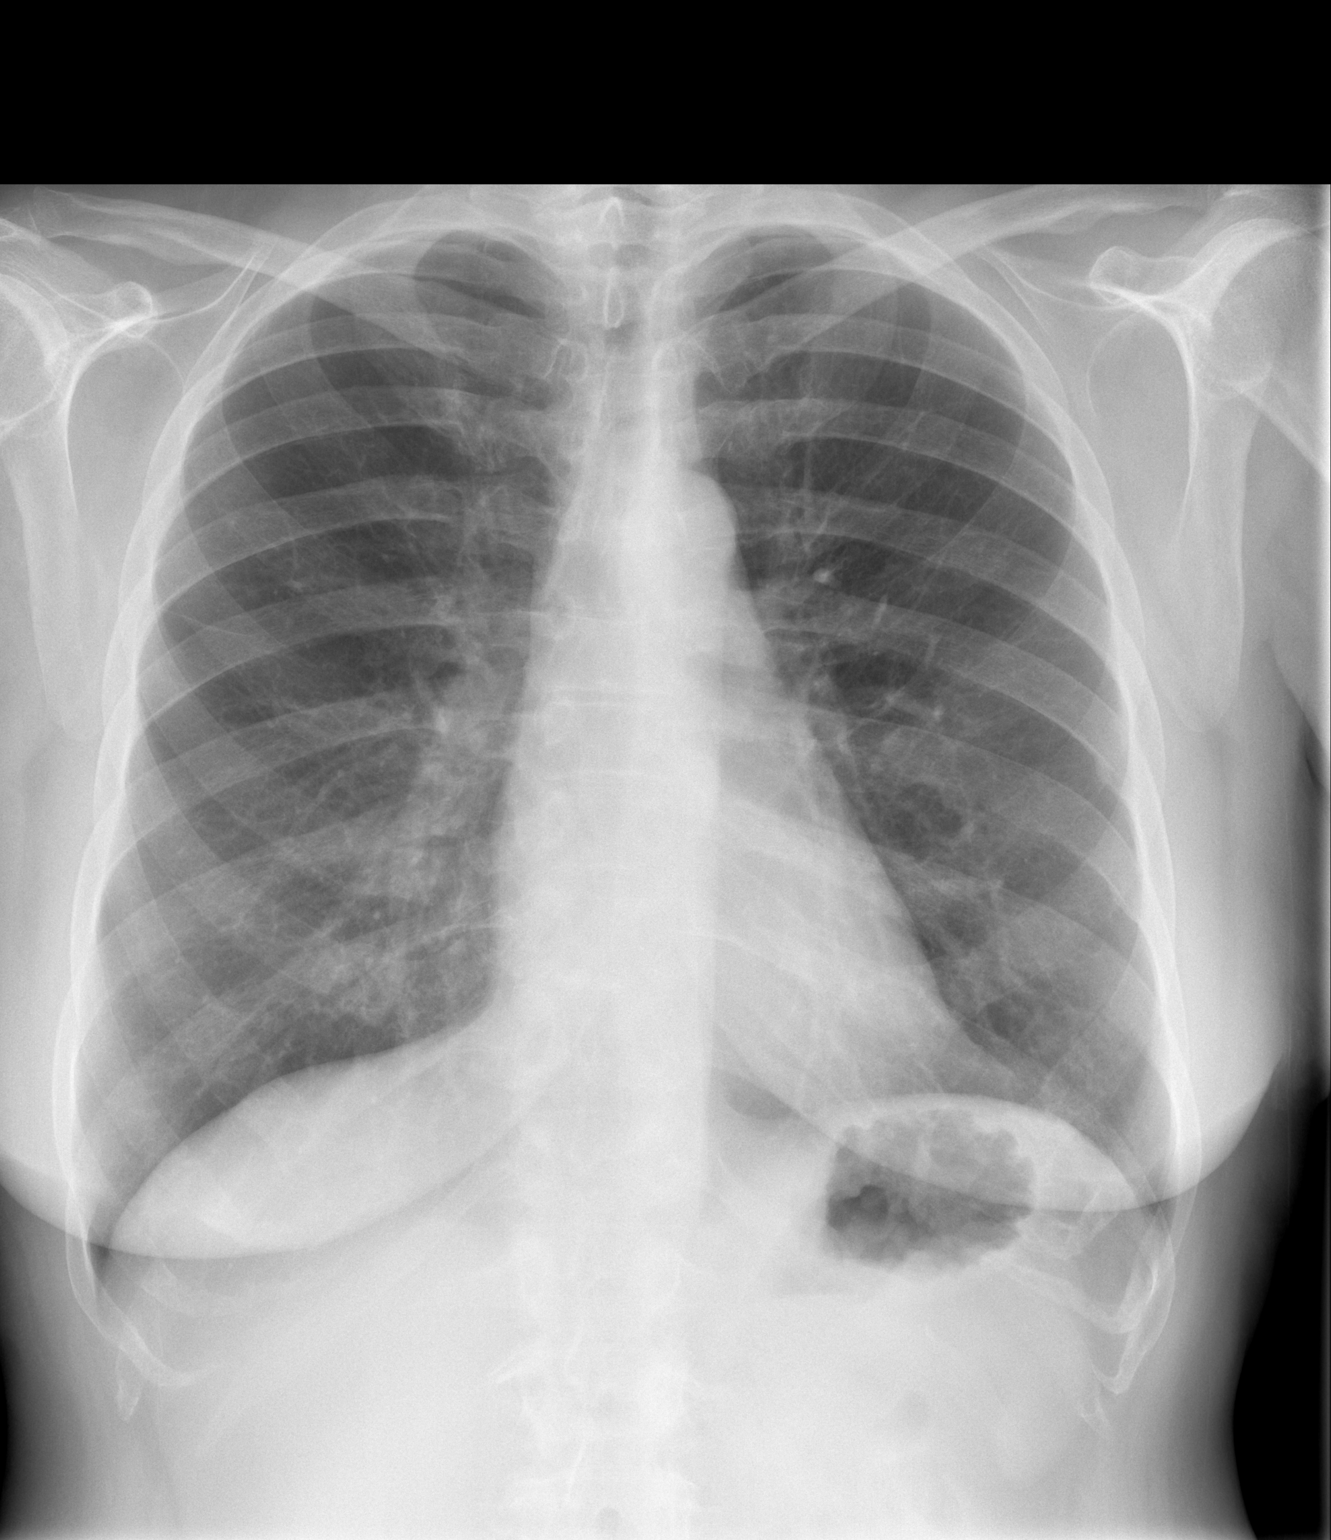

[w chest lat]
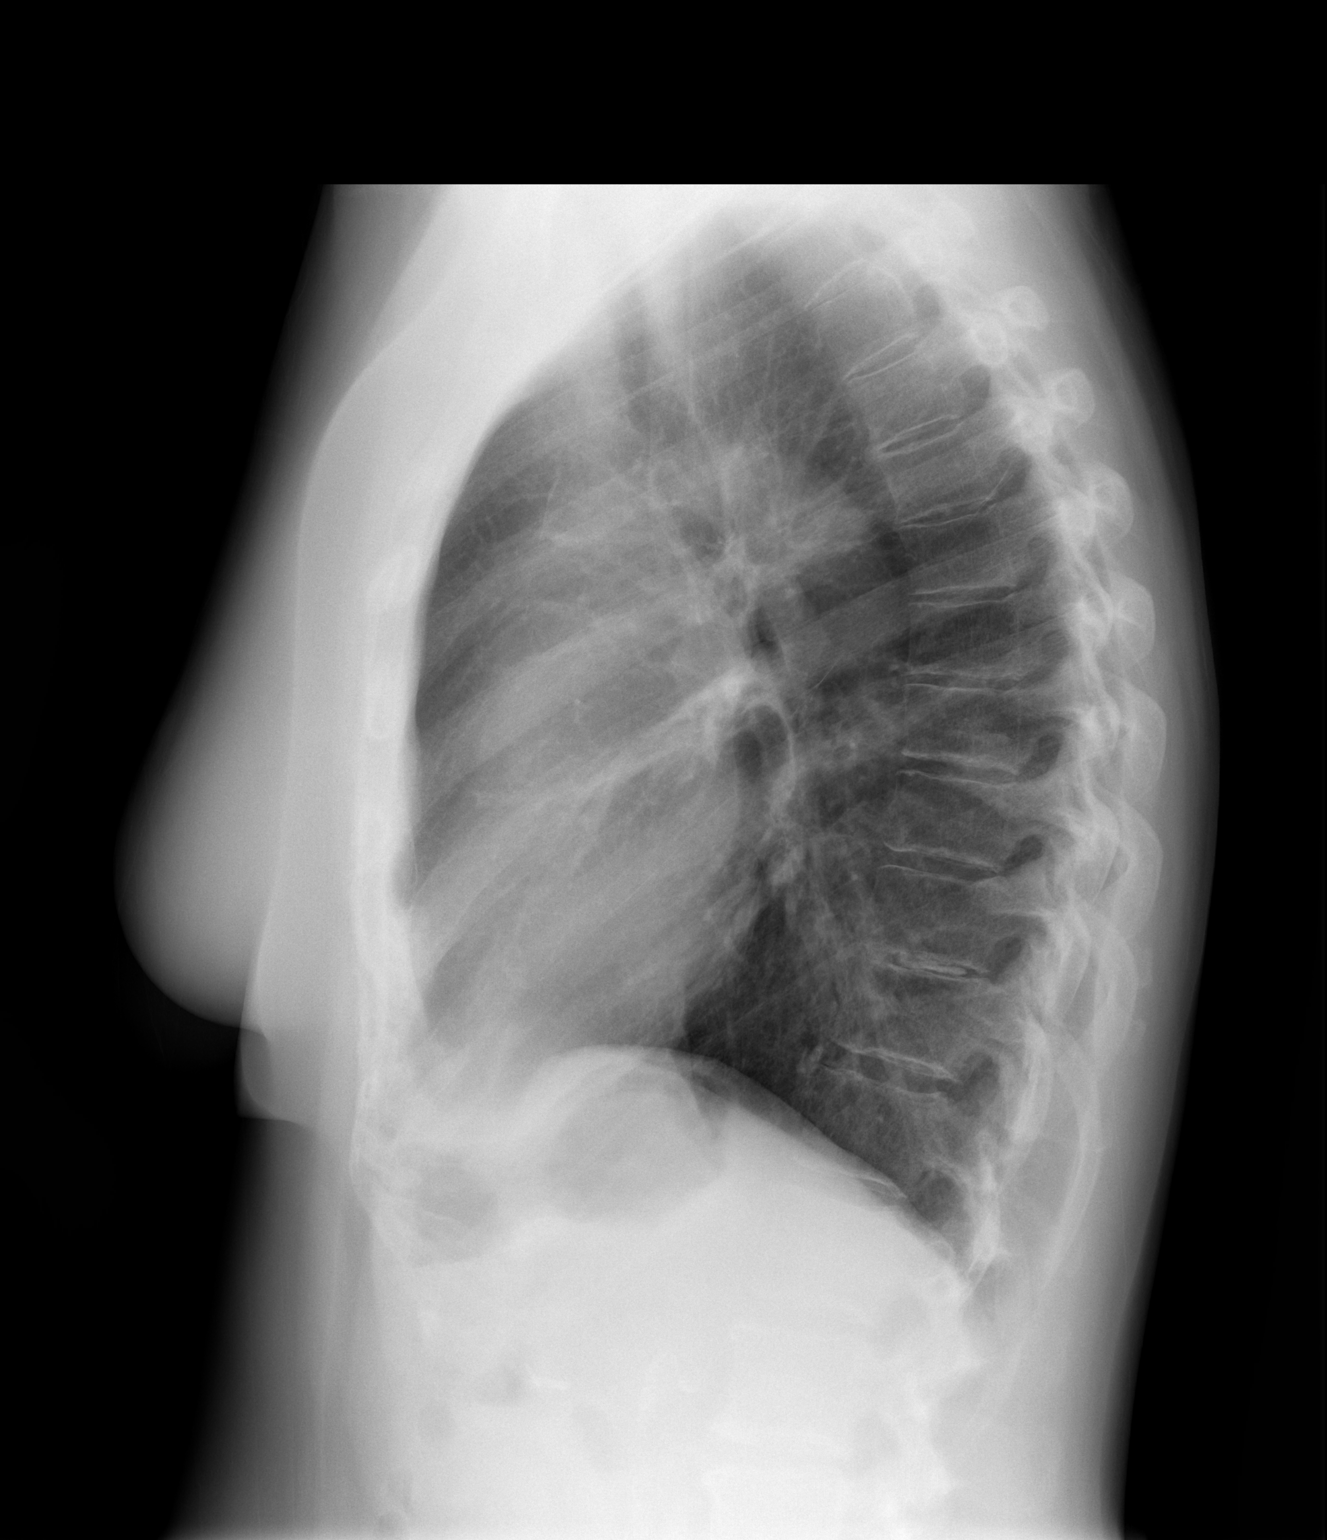

[2 of 2 positions shown; findings below may reference images not displayed]

FINDINGS: The lungs are well-expanded. There is no focal infiltrate. The
interstitial markings are coarse in the infrahilar regions
bilaterally but this is not new. There is no pleural effusion. The
heart and pulmonary vascularity are normal. There is calcification
in the wall of the aortic arch. There is old deformity of the
lateral aspect of the left seventh rib which was not visible on the
previous study.
IMPRESSION: Mild chronic bronchitic changes, stable. No evidence of pneumonia
nor CHF.

## 2018-06-08 ENCOUNTER — Encounter (HOSPITAL_COMMUNITY): Payer: Medicare HMO

## 2018-06-08 ENCOUNTER — Ambulatory Visit: Payer: Medicare HMO | Admitting: Family

## 2018-06-17 ENCOUNTER — Ambulatory Visit (HOSPITAL_COMMUNITY)
Admission: RE | Admit: 2018-06-17 | Discharge: 2018-06-17 | Disposition: A | Discharge status: Home / Self Care | Payer: Medicare HMO | Source: Ambulatory Visit | Attending: Family | Admitting: Family

## 2018-06-17 ENCOUNTER — Ambulatory Visit: Payer: Medicare HMO | Admitting: Family

## 2018-06-17 ENCOUNTER — Encounter: Payer: Self-pay | Admitting: Family

## 2018-06-17 ENCOUNTER — Other Ambulatory Visit: Payer: Self-pay

## 2018-06-17 VITALS — BP 151/73 | HR 72 | Resp 18 | Ht 65.0 in | Wt 142.0 lb

## 2018-06-17 DIAGNOSIS — I6523 Occlusion and stenosis of bilateral carotid arteries: Secondary | ICD-10-CM

## 2018-06-17 DIAGNOSIS — Z87891 Personal history of nicotine dependence: Secondary | ICD-10-CM | POA: Diagnosis not present

## 2018-06-17 NOTE — Patient Instructions (Signed)

## 2018-06-17 NOTE — Progress Notes (Signed)
Chief Complaint: Follow up Extracranial Carotid Artery Stenosis   History of Present Illness  Alexandria Mitchell is a 67 y.o. female whom Dr. Myra Gianotti has been monitoring for carotidarteryocclusive disease.  She was originally referred with an ultrasound that showed greater than 80% left carotid stenosis. This was repeated in ouroffice and we could not reproduce those results. Because the patient remained asymptomatic, no intervention was performed.  She denies any known history of stroke or TIA. Specificallyshe deniesa history of amaurosis fugax or monocular blindness, unilateral facial drooping, hemiplegia, orreceptive or expressive aphasia.   Dr. Myra Gianotti last evaluated pt on 08-20-15. At that timecarotid duplex showed40-59 percent right-sided stenosis and 60-79%left sided stenosis. There were no significant changes from prior visit The patient remainedasymptomatic and her carotid duplex remained stable, continue with surveillance.  She denies claudication sx's in her legs with waking.   Pt states her blood pressure is usually 120's/80's  Diabetic: no Tobaccos use: former smoker, quit in the 1980's, started at age 36  Pt meds include: Statin : yes ASA: yes Other anticoagulants/antiplatelets: no   Past Medical History:  Diagnosis Date  . Carotid artery occlusion   . GERD (gastroesophageal reflux disease)   . Thyroid disease     Social History Social History   Tobacco Use  . Smoking status: Former Smoker    Types: Cigarettes    Start date: 02/15/1988  . Smokeless tobacco: Never Used  Substance Use Topics  . Alcohol use: Yes    Alcohol/week: 6.0 standard drinks    Types: 6 Shots of liquor per week  . Drug use: No    Family History Family History  Problem Relation Age of Onset  . Kidney disease Mother   . Kidney disease Father   . Cancer Father   . Heart disease Father        Heart Disease before age 58- Atrial Fibrillation  . Hyperlipidemia Father    . Hypertension Father   . Heart attack Father   . Diabetes Sister   . Heart disease Brother        Heart Disease before age 38  . Hyperlipidemia Brother   . Hypertension Brother   . Peripheral vascular disease Brother     Surgical History Past Surgical History:  Procedure Laterality Date  . CHOLECYSTECTOMY  2005   Gall Bladder  . GALLBLADDER SURGERY  2005  . THYROIDECTOMY  1977    Allergies  Allergen Reactions  . Hydrochlorothiazide Rash  . Sulfa Antibiotics Rash    Current Outpatient Medications  Medication Sig Dispense Refill  . albuterol (PROVENTIL) (2.5 MG/3ML) 0.083% nebulizer solution Take 3 mLs (2.5 mg total) by nebulization every 4 (four) hours as needed for wheezing or shortness of breath. 75 mL 0  . aspirin 81 MG tablet Take 81 mg by mouth daily.    Marland Kitchen atorvastatin (LIPITOR) 40 MG tablet TAKE 1 TABLET (40 MG TOTAL) BY MOUTH DAILY.    . calcium citrate-vitamin D (CITRACAL+D) 315-200 MG-UNIT tablet Take by mouth.    . cefUROXime (CEFTIN) 500 MG tablet Take 500 mg by mouth 2 (two) times daily with a meal.    . cetirizine (ZYRTEC) 10 MG tablet Take 10 mg by mouth daily.    . cholecalciferol (VITAMIN D) 1000 UNITS tablet Take 1,000 Units by mouth daily.    . Cyanocobalamin (RA VITAMIN B-12 TR) 1000 MCG TBCR Take by mouth.    . escitalopram (LEXAPRO) 10 MG tablet TAKE 1 TABLET (10 MG TOTAL) BY MOUTH DAILY.    Marland Kitchen  fluticasone furoate-vilanterol (BREO ELLIPTA) 200-25 MCG/INH AEPB Inhale 1 puff into the lungs daily. 1 each 5  . furosemide (LASIX) 40 MG tablet TAKE 1 TABLET EVERY MORNING AS NEEDED FOR EDEMA    . ibuprofen (ADVIL,MOTRIN) 800 MG tablet TAKE 1 TABLET EVERY 6 TO 8 HOURS AS NEEDED FOR PAIN  0  . levothyroxine (SYNTHROID, LEVOTHROID) 100 MCG tablet TAKE 1 TABLET EVERY DAY    . pantoprazole (PROTONIX) 40 MG tablet Take 40 mg by mouth daily.    Marland Kitchen azelastine (ASTELIN) 0.1 % nasal spray Place 2 sprays into both nostrils 2 (two) times daily. (Patient not taking:  Reported on 04/10/2017) 30 mL 5   No current facility-administered medications for this visit.     Review of Systems : See HPI for pertinent positives and negatives.  Physical Examination  Vitals:   06/17/18 1459 06/17/18 1501  BP: 140/75 (!) 151/73  Pulse: 72   Resp: 18   SpO2: 96%   Weight: 142 lb (64.4 kg)   Height: 5\' 5"  (1.651 m)    Body mass index is 23.63 kg/m.  General: WDWN femalein NAD GAIT:normal HENT: no gross abnormalities  Eyes: PERRLA Pulmonary: Respirations are non labored, CTAB, no rales, rhonchi or wheezes.   Cardiac: regular rhythmand rate, nodetected murmur.  VASCULAR EXAM Carotid Bruits Right Left   Negative Negative   Abdominal aortic pulseis notpalpable. Radial pulses are 2+palpable and equal.   LE Pulses Right Left  POPLITEAL notpalpable  notpalpable  POSTERIOR TIBIAL palpable  palpable   DORSALIS PEDIS ANTERIOR TIBIAL notpalpable  notpalpable     Gastrointestinal: soft, nontender, BS WNL, no r/g, nopalpable masses Musculoskeletal: No muscle atrophy/wasting. M/S 5/5 throughout, extremities without ischemic changes. Skin: No rashes, no ulcers, no cellulitis.   Neurologic:  A&O X 3; appropriate affect, sensation is normal; speech is normal, CN 2-12 intact, pain and light touch intact in extremities, motor exam as listed above. Psychiatric: Normal thought content, mood appropriate to clinical situation.     Assessment: Alexandria Mitchell is a 67 y.o. female who has no history of stroke or TIA.  She was originally referred with an ultrasound that showed greater than 80% left carotid stenosis. This was repeated in ouroffice and we could not reproduce those results. Because the patient remained asymptomatic, no intervention was performed.  Fortunately she does not have DM, quit smoking in the 1980's, has a normal BMI, and remains physically active.    DATA CarotidDuplex(06-17-18): 40-59% right ICA stenosis. 60-79% left ICA stenosis. Bilateral vertebral artery flow is antegrade.  Bilateral subclavian artery waveforms are normal. No significant change compared to the exams on 10-03-16, 04-10-17, and 12-01-17.   Plan:   Follow-up in Carotid Duplex scan.   I discussed in depth with the patient the nature of atherosclerosis, and emphasized the importance of maximal medical management including strict control of blood pressure, blood glucose, and lipid levels, obtaining regular exercise, and continued cessation of smoking.  The patient is aware that without maximal medical management the underlying atherosclerotic disease process will progress, limiting the benefit of any interventions. The patient was given information about stroke prevention and what symptoms should prompt the patient to seek immediate medical care. Thank you for allowing [place] to participate in this patient's care.  Charisse March, RN, MSN, FNP-C [facility] Office: [phone]  Clinic Physician: Darrick Penna  06/17/18 3:06 PM

## 2018-11-16 ENCOUNTER — Telehealth: Payer: Self-pay | Admitting: *Deleted

## 2018-11-16 NOTE — Telephone Encounter (Signed)
Received a fax requesting Vascular clearance for patient to get a colonoscopy. Reviewed request with Rosalita Chessman Nickel NP. We have never done an intervention on this patient and no clearance from [place] would be relevant to procedure patient is having done. Called [facility] and spoke with Marchelle Folks let her know if they need medical clearance for this patient they need to contact her PCP. She verbalized understanding.

## 2019-03-29 DIAGNOSIS — M5416 Radiculopathy, lumbar region: Secondary | ICD-10-CM | POA: Insufficient documentation

## 2020-03-05 ENCOUNTER — Other Ambulatory Visit: Payer: Self-pay | Admitting: *Deleted

## 2020-03-05 DIAGNOSIS — I6523 Occlusion and stenosis of bilateral carotid arteries: Secondary | ICD-10-CM

## 2020-03-19 ENCOUNTER — Ambulatory Visit (HOSPITAL_COMMUNITY)
Admission: RE | Admit: 2020-03-19 | Discharge: 2020-03-19 | Disposition: A | Discharge status: Home / Self Care | Payer: Medicare HMO | Source: Ambulatory Visit | Attending: Surgery | Admitting: Surgery

## 2020-03-19 ENCOUNTER — Other Ambulatory Visit: Payer: Self-pay

## 2020-03-19 ENCOUNTER — Ambulatory Visit: Payer: Medicare HMO | Admitting: Physician Assistant

## 2020-03-19 VITALS — BP 175/92 | HR 96 | Temp 97.7°F | Resp 20 | Ht 65.0 in | Wt 143.4 lb

## 2020-03-19 DIAGNOSIS — I6523 Occlusion and stenosis of bilateral carotid arteries: Secondary | ICD-10-CM

## 2020-03-19 NOTE — Progress Notes (Signed)
Office Note     CC:  follow up Requesting Provider:  Vernona Rieger, MD  HPI: Alexandria Mitchell is a 69 y.o. (10/06/1950) female who presents for routine surveillance of bilateral carotid artery stenosis.  The patient has had no prior CVA or TIAs.  She has had multiple carotid artery duplex examinations beginning in January 2018.  She denies symptoms of TIA/stroke. No claudication.  She is compliant with statin and aspirin. Not diabetic; no history of hypertension. Quit smoking in 1989 Past Medical History:  Diagnosis Date  . Carotid artery occlusion   . GERD (gastroesophageal reflux disease)   . Thyroid disease     Past Surgical History:  Procedure Laterality Date  . CHOLECYSTECTOMY  2005   Gall Bladder  . GALLBLADDER SURGERY  2005  . THYROIDECTOMY  1977    Social History   Socioeconomic History  . Marital status: Divorced    Spouse name: Not on file  . Number of children: Not on file  . Years of education: Not on file  . Highest education level: Not on file  Occupational History  . Not on file  Tobacco Use  . Smoking status: Former Smoker    Types: Cigarettes    Start date: 02/15/1988  . Smokeless tobacco: Never Used  Vaping Use  . Vaping Use: Never used  Substance and Sexual Activity  . Alcohol use: Yes    Alcohol/week: 6.0 standard drinks    Types: 6 Shots of liquor per week  . Drug use: No  . Sexual activity: Not Currently  Other Topics Concern  . Not on file  Social History Narrative  . Not on file   Social Determinants of Health   Financial Resource Strain:   . Difficulty of Paying Living Expenses:   Food Insecurity:   . Worried About Programme researcher, broadcasting/film/video in the Last Year:   . Barista in the Last Year:   Transportation Needs:   . Freight forwarder (Medical):   Marland Kitchen Lack of Transportation (Non-Medical):   Physical Activity:   . Days of Exercise per Week:   . Minutes of Exercise per Session:   Stress:   . Feeling of Stress :   Social  Connections:   . Frequency of Communication with Friends and Family:   . Frequency of Social Gatherings with Friends and Family:   . Attends Religious Services:   . Active Member of Clubs or Organizations:   . Attends Banker Meetings:   Marland Kitchen Marital Status:   Intimate Partner Violence:   . Fear of Current or Ex-Partner:   . Emotionally Abused:   Marland Kitchen Physically Abused:   . Sexually Abused:    Family History  Problem Relation Age of Onset  . Kidney disease Mother   . Kidney disease Father   . Cancer Father   . Heart disease Father        Heart Disease before age 79- Atrial Fibrillation  . Hyperlipidemia Father   . Hypertension Father   . Heart attack Father   . Diabetes Sister   . Heart disease Brother        Heart Disease before age 63  . Hyperlipidemia Brother   . Hypertension Brother   . Peripheral vascular disease Brother     Current Outpatient Medications  Medication Sig Dispense Refill  . albuterol (PROVENTIL) (2.5 MG/3ML) 0.083% nebulizer solution Take 3 mLs (2.5 mg total) by nebulization every 4 (four) hours as needed for wheezing or  shortness of breath. 75 mL 0  . aspirin 81 MG tablet Take 81 mg by mouth daily.    Marland Kitchen atorvastatin (LIPITOR) 40 MG tablet TAKE 1 TABLET (40 MG TOTAL) BY MOUTH DAILY.    Marland Kitchen azelastine (ASTELIN) 0.1 % nasal spray Place 2 sprays into both nostrils 2 (two) times daily. (Patient not taking: Reported on 04/10/2017) 30 mL 5  . calcium citrate-vitamin D (CITRACAL+D) 315-200 MG-UNIT tablet Take by mouth.    . cefUROXime (CEFTIN) 500 MG tablet Take 500 mg by mouth 2 (two) times daily with a meal.    . cetirizine (ZYRTEC) 10 MG tablet Take 10 mg by mouth daily.    . cholecalciferol (VITAMIN D) 1000 UNITS tablet Take 1,000 Units by mouth daily.    . Cyanocobalamin (RA VITAMIN B-12 TR) 1000 MCG TBCR Take by mouth.    . escitalopram (LEXAPRO) 10 MG tablet TAKE 1 TABLET (10 MG TOTAL) BY MOUTH DAILY.    . fluticasone furoate-vilanterol (BREO  ELLIPTA) 200-25 MCG/INH AEPB Inhale 1 puff into the lungs daily. 1 each 5  . furosemide (LASIX) 40 MG tablet TAKE 1 TABLET EVERY MORNING AS NEEDED FOR EDEMA    . ibuprofen (ADVIL,MOTRIN) 800 MG tablet TAKE 1 TABLET EVERY 6 TO 8 HOURS AS NEEDED FOR PAIN  0  . levothyroxine (SYNTHROID, LEVOTHROID) 100 MCG tablet TAKE 1 TABLET EVERY DAY    . pantoprazole (PROTONIX) 40 MG tablet Take 40 mg by mouth daily.     No current facility-administered medications for this visit.    Allergies  Allergen Reactions  . Hydrochlorothiazide Rash  . Sulfa Antibiotics Rash     REVIEW OF SYSTEMS:   [X]  denotes positive finding, [ ]  denotes negative finding Cardiac  Comments:  Chest pain or chest pressure:    Shortness of breath upon exertion:    Short of breath when lying flat:    Irregular heart rhythm:        Vascular    Pain in calf, thigh, or hip brought on by ambulation:    Pain in feet at night that wakes you up from your sleep:     Blood clot in your veins:    Leg swelling:         Pulmonary    Oxygen at home:    Productive cough:     Wheezing:         Neurologic    Sudden weakness in arms or legs:     Sudden numbness in arms or legs:     Sudden onset of difficulty speaking or slurred speech:    Temporary loss of vision in one eye:     Problems with dizziness:         Gastrointestinal    Blood in stool:     Vomited blood:         Genitourinary    Burning when urinating:     Blood in urine:        Psychiatric    Major depression:         Hematologic    Bleeding problems:    Problems with blood clotting too easily:        Skin    Rashes or ulcers:        Constitutional    Fever or chills:      PHYSICAL EXAMINATION:  Vitals:   03/19/20 1359 03/19/20 1401  BP: (!) 169/78 (!) 175/92  Pulse: 96   Resp: 20   Temp: 97.7 F (  36.5 C)   SpO2: 96%    General:  WDWN in NAD; vital signs documented above Gait: unaided; no ataxia HENT: WNL, normocephalic Pulmonary:  normal non-labored breathing , without Rales, rhonchi,  wheezing Cardiac: regular HR, without  Murmurs without carotid bruit Abdomen: soft, NT, no masses Skin: without rashes Vascular Exam/Pulses: 2+ pedal, femoral, radial, brachial pulses bilaterally Extremities: without ischemic changes, without Gangrene , without cellulitis; without open wounds;  Musculoskeletal: no muscle wasting or atrophy  Neurologic: A&O X 3;  No focal weakness or paresthesias are detected Psychiatric:  The pt has Normal affect.   Non-Invasive Vascular Imaging:   03/19/2020: Summary:  Right Carotid: Velocities in the right ICA are consistent with a 40-59% stenosis.   Left Carotid: Velocities in the left ICA are consistent with a 60-79% stenosis.   Vertebrals: Bilateral vertebral arteries demonstrate antegrade flow.  Subclavians: Normal flow hemodynamics were seen in bilateral subclavian        arteries.   CarotidDuplex(06-17-18): 40-59% right ICA stenosis. 60-79% left ICA stenosis. Bilateral vertebral artery flow is antegrade.  Bilateral subclavian artery waveforms are normal. No significant change compared to the examson 10-03-16, 04-10-17, and 12-01-17.   ASSESSMENT/PLAN:: 69 y.o. female here for follow up for bilateral carotid artery stenosis. Stable duplex. Asymptomatic.  Compliant with medications. Follow-up in 6 months with duplex. Seek immediate medical attention for s/sx of stroke/TIA.   Milinda Antis, PA-C [facility] [phone]  Clinic MD:  Myra Gianotti

## 2020-03-20 ENCOUNTER — Other Ambulatory Visit: Payer: Self-pay | Admitting: *Deleted

## 2020-03-20 DIAGNOSIS — I6523 Occlusion and stenosis of bilateral carotid arteries: Secondary | ICD-10-CM

## 2021-03-08 DIAGNOSIS — E876 Hypokalemia: Secondary | ICD-10-CM | POA: Insufficient documentation

## 2021-03-08 DIAGNOSIS — I6523 Occlusion and stenosis of bilateral carotid arteries: Secondary | ICD-10-CM | POA: Insufficient documentation

## 2021-03-21 ENCOUNTER — Other Ambulatory Visit: Payer: Self-pay

## 2021-03-21 DIAGNOSIS — I6523 Occlusion and stenosis of bilateral carotid arteries: Secondary | ICD-10-CM

## 2021-03-28 ENCOUNTER — Ambulatory Visit (HOSPITAL_COMMUNITY)
Admission: RE | Admit: 2021-03-28 | Discharge: 2021-03-28 | Disposition: A | Discharge status: Home / Self Care | Payer: Medicare HMO | Source: Ambulatory Visit | Attending: Vascular Surgery | Admitting: Vascular Surgery

## 2021-03-28 ENCOUNTER — Encounter: Payer: Self-pay | Admitting: Physician Assistant

## 2021-03-28 ENCOUNTER — Ambulatory Visit: Payer: Medicare HMO | Admitting: Physician Assistant

## 2021-03-28 ENCOUNTER — Other Ambulatory Visit: Payer: Self-pay

## 2021-03-28 VITALS — BP 177/78 | HR 72 | Temp 98.0°F | Resp 20 | Ht 65.0 in | Wt 135.3 lb

## 2021-03-28 DIAGNOSIS — K589 Irritable bowel syndrome without diarrhea: Secondary | ICD-10-CM | POA: Insufficient documentation

## 2021-03-28 DIAGNOSIS — F411 Generalized anxiety disorder: Secondary | ICD-10-CM | POA: Insufficient documentation

## 2021-03-28 DIAGNOSIS — I6523 Occlusion and stenosis of bilateral carotid arteries: Secondary | ICD-10-CM

## 2021-03-28 DIAGNOSIS — J8283 Eosinophilic asthma: Secondary | ICD-10-CM | POA: Insufficient documentation

## 2021-03-28 DIAGNOSIS — K5790 Diverticulosis of intestine, part unspecified, without perforation or abscess without bleeding: Secondary | ICD-10-CM | POA: Insufficient documentation

## 2021-03-28 DIAGNOSIS — R350 Frequency of micturition: Secondary | ICD-10-CM | POA: Insufficient documentation

## 2021-03-28 DIAGNOSIS — M1990 Unspecified osteoarthritis, unspecified site: Secondary | ICD-10-CM | POA: Insufficient documentation

## 2021-03-28 DIAGNOSIS — R600 Localized edema: Secondary | ICD-10-CM | POA: Insufficient documentation

## 2021-03-28 DIAGNOSIS — K635 Polyp of colon: Secondary | ICD-10-CM | POA: Insufficient documentation

## 2021-03-28 NOTE — Progress Notes (Signed)
History of Present Illness:  Patient is a 70 y.o. year old female who presents for evaluation of carotid stenosis.  She has been followed since 2005 after a screening for carotid stenosis.  She is followed by Dr. Myra Gianotti in our office.   The patient denies symptoms of TIA, amaurosis, or stroke.  The patient is currently on ASA antiplatelet therapy.  She does take a daily Statin.    Past Medical History:  Diagnosis Date   Carotid artery occlusion    GERD (gastroesophageal reflux disease)    Thyroid disease     Past Surgical History:  Procedure Laterality Date   CHOLECYSTECTOMY  2005   Gall Bladder   GALLBLADDER SURGERY  2005   THYROIDECTOMY  1977     Social History Social History   Tobacco Use   Smoking status: Former    Types: Cigarettes    Start date: 02/15/1988   Smokeless tobacco: Never  Vaping Use   Vaping Use: Never used  Substance Use Topics   Alcohol use: Yes    Alcohol/week: 6.0 standard drinks    Types: 6 Shots of liquor per week   Drug use: No    Family History Family History  Problem Relation Age of Onset   Kidney disease Mother    Kidney disease Father    Cancer Father    Heart disease Father        Heart Disease before age 33- Atrial Fibrillation   Hyperlipidemia Father    Hypertension Father    Heart attack Father    Diabetes Sister    Heart disease Brother        Heart Disease before age 59   Hyperlipidemia Brother    Hypertension Brother    Peripheral vascular disease Brother     Allergies  Allergies  Allergen Reactions   Hydrochlorothiazide Rash   Sulfa Antibiotics Rash     Current Outpatient Medications  Medication Sig Dispense Refill   albuterol (PROVENTIL) (2.5 MG/3ML) 0.083% nebulizer solution Take 3 mLs (2.5 mg total) by nebulization every 4 (four) hours as needed for wheezing or shortness of breath. 75 mL 0   aspirin 81 MG tablet Take 81 mg by mouth daily.     atorvastatin (LIPITOR) 40 MG tablet TAKE 1 TABLET (40 MG  TOTAL) BY MOUTH DAILY.     azelastine (ASTELIN) 0.1 % nasal spray Place 2 sprays into both nostrils 2 (two) times daily. 30 mL 5   Benralizumab (FASENRA) 30 MG/ML SOSY      calcium citrate-vitamin D (CITRACAL+D) 315-200 MG-UNIT tablet Take by mouth.     cefUROXime (CEFTIN) 500 MG tablet Take 500 mg by mouth 2 (two) times daily with a meal.     cetirizine (ZYRTEC) 10 MG tablet Take 10 mg by mouth daily.     cholecalciferol (VITAMIN D) 1000 UNITS tablet Take 1,000 Units by mouth daily.     Cyanocobalamin 1000 MCG TBCR Take by mouth.     escitalopram (LEXAPRO) 10 MG tablet TAKE 1 TABLET (10 MG TOTAL) BY MOUTH DAILY.     fluticasone furoate-vilanterol (BREO ELLIPTA) 200-25 MCG/INH AEPB Inhale 1 puff into the lungs daily. 1 each 5   furosemide (LASIX) 40 MG tablet TAKE 1 TABLET EVERY MORNING AS NEEDED FOR EDEMA     ibuprofen (ADVIL,MOTRIN) 800 MG tablet TAKE 1 TABLET EVERY 6 TO 8 HOURS AS NEEDED FOR PAIN  0   levothyroxine (SYNTHROID, LEVOTHROID) 100 MCG tablet TAKE 1 TABLET EVERY  DAY     pantoprazole (PROTONIX) 40 MG tablet Take 40 mg by mouth daily.     No current facility-administered medications for this visit.    ROS:   General:  No weight loss, Fever, chills  HEENT: No recent headaches, no nasal bleeding, no visual changes, no sore throat  Neurologic: No dizziness, blackouts, seizures. No recent symptoms of stroke or mini- stroke. No recent episodes of slurred speech, or temporary blindness.  Cardiac: No recent episodes of chest pain/pressure, no shortness of breath at rest.  No shortness of breath with exertion.  Denies history of atrial fibrillation or irregular heartbeat  Vascular: No history of rest pain in feet.  No history of claudication.  No history of non-healing ulcer, No history of DVT   Pulmonary: No home oxygen, no productive cough, no hemoptysis,  No asthma or wheezing  Musculoskeletal:  [ ]  Arthritis, [ ]  Low back pain,  [ ]  Joint pain  Hematologic:No history of  hypercoagulable state.  No history of easy bleeding.  No history of anemia  Gastrointestinal: No hematochezia or melena,  No gastroesophageal reflux, no trouble swallowing  Urinary: [ ]  chronic Kidney disease, [ ]  on HD - [ ]  MWF or [ ]  TTHS, [ ]  Burning with urination, [ ]  Frequent urination, [ ]  Difficulty urinating;   Skin: No rashes  Psychological: No history of anxiety,  No history of depression   Physical Examination  Vitals:   03/28/21 1509 03/28/21 1511  BP: (!) 181/82 (!) 177/78  Pulse: 72   Resp: 20   Temp: 98 F (36.7 C)   TempSrc: Temporal   SpO2: 95%   Weight: 135 lb 4.8 oz (61.4 kg)   Height: 5\' 5"  (1.651 m)     Body mass index is 22.52 kg/m.  General:  Alert and oriented, no acute distress HEENT: Normal Neck: No bruit or JVD Pulmonary: Clear to auscultation bilaterally Cardiac: Regular Rate and Rhythm without murmur Gastrointestinal: Soft, non-tender, non-distended, no mass, no scars Skin: No rash Extremity Pulses:  2+ radial, brachial, femoral, dorsalis pedis, posterior tibial pulses bilaterally Musculoskeletal: No deformity or edema  Neurologic: Upper and lower extremity motor 5/5 and symmetric  DATA:  Right Carotid Findings:  +----------+--------+--------+--------+-------------------------+---------+             PSV cm/sEDV cm/sStenosisPlaque Description       Comments    +----------+--------+--------+--------+-------------------------+---------+   CCA Prox  62      13                                                   +----------+--------+--------+--------+-------------------------+---------+   CCA Mid   61      13                                                   +----------+--------+--------+--------+-------------------------+---------+   CCA Distal53      11              heterogenous and  calcificShadowing  +----------+--------+--------+--------+-------------------------+---------+   ICA Prox  156     42       40-59%  heterogenous and  calcificShadowing  +----------+--------+--------+--------+-------------------------+---------+   ICA Mid   216  72      60-79%  heterogenous                         +----------+--------+--------+--------+-------------------------+---------+   ICA Distal180     43                                                   +----------+--------+--------+--------+-------------------------+---------+   ECA       112     5               heterogenous                         +----------+--------+--------+--------+-------------------------+---------+    +----------+--------+-------+----------------+-------------------+            PSV cm/sEDV cmsDescribe        Arm Pressure (mmHG)  +----------+--------+-------+----------------+-------------------+  [id number]             Multiphasic, WNL                     +----------+--------+-------+----------------+-------------------+   +---------+--------+--+--------+-+---------+  VertebralPSV cm/s35EDV cm/s6Antegrade  +---------+--------+--+--------+-+---------+       Left Carotid Findings:  +----------+--------+--------+--------+-------------------------+---------+             PSV cm/sEDV cm/sStenosisPlaque Description       Comments    +----------+--------+--------+--------+-------------------------+---------+   CCA Prox  88      18                                                   +----------+--------+--------+--------+-------------------------+---------+   CCA Mid   74      17                                                   +----------+--------+--------+--------+-------------------------+---------+   CCA Distal60      14              heterogenous and  calcificShadowing  +----------+--------+--------+--------+-------------------------+---------+   ICA Prox  240     70      60-79%  heterogenous and  calcificShadowing   +----------+--------+--------+--------+-------------------------+---------+   ICA Mid   80      23                                                   +----------+--------+--------+--------+-------------------------+---------+   ICA Distal93      32                                                   +----------+--------+--------+--------+-------------------------+---------+   ECA       118     6               heterogenous                         +----------+--------+--------+--------+-------------------------+---------+    +----------+--------+--------+----------------+-------------------+  PSV cm/sEDV cm/sDescribe        Arm Pressure (mmHG)  +----------+--------+--------+----------------+-------------------+  [id number]              Multiphasic, WNL                     +----------+--------+--------+----------------+-------------------+   +---------+--------+--+--------+--+---------+  VertebralPSV cm/s60EDV cm/s14Antegrade  +---------+--------+--+--------+--+---------+    Summary:  Right Carotid: Velocities in the right ICA are consistent with a 60-79%                 stenosis.   Left Carotid: Velocities in the left ICA are consistent with a 60-79%  stenosis.   Vertebrals: Bilateral vertebral arteries demonstrate antegrade   ASSESSMENT/ PLAN: She remains asymptomatic without symptoms of stroke or TIA Her carotid duplex is unchanged from a year ago reading 60-79% B without significant increase in velocity.  She will exercise as tolerates, cont asa and Statin daily.  F/U in 1 year for repeat carotid duplex.       Mosetta Pigeon PA-C [facility] [place] Office: [phone]  MD in clinic [place]

## 2021-04-01 ENCOUNTER — Other Ambulatory Visit: Payer: Self-pay | Admitting: *Deleted

## 2021-04-01 DIAGNOSIS — I6523 Occlusion and stenosis of bilateral carotid arteries: Secondary | ICD-10-CM

## 2022-03-24 ENCOUNTER — Other Ambulatory Visit: Payer: Self-pay | Admitting: *Deleted

## 2022-03-24 DIAGNOSIS — I6523 Occlusion and stenosis of bilateral carotid arteries: Secondary | ICD-10-CM

## 2022-04-14 ENCOUNTER — Ambulatory Visit (HOSPITAL_COMMUNITY)
Admission: RE | Admit: 2022-04-14 | Discharge: 2022-04-14 | Disposition: A | Discharge status: Home / Self Care | Payer: Medicare HMO | Source: Ambulatory Visit | Attending: Surgery | Admitting: Surgery

## 2022-04-14 ENCOUNTER — Ambulatory Visit: Payer: Medicare HMO | Admitting: Physician Assistant

## 2022-04-14 VITALS — BP 153/80 | HR 78 | Temp 97.7°F | Resp 14 | Ht 65.5 in | Wt 131.0 lb

## 2022-04-14 DIAGNOSIS — I6523 Occlusion and stenosis of bilateral carotid arteries: Secondary | ICD-10-CM

## 2022-04-14 NOTE — Progress Notes (Signed)
Office Note     CC:  follow up Requesting Provider:  Vernona Rieger, MD  HPI: Alexandria Mitchell is a 71 y.o. (1951/02/05) female who presents for surveillance of carotid artery stenosis.  Since last office visit 1 year ago she denies any diagnosis of CVA or TIA.  She also denies any neurological changes including slurring speech, changes in vision, or one-sided weakness.  She is a former smoker.  She is on aspirin and statin daily.  She has a bunion on her right foot which has not been fixed due to "poor circulation" diagnosed by her podiatrist.  She denies any claudication, rest pain, or tissue loss.   Past Medical History:  Diagnosis Date   Carotid artery occlusion    GERD (gastroesophageal reflux disease)    Thyroid disease     Past Surgical History:  Procedure Laterality Date   CHOLECYSTECTOMY  2005   Gall Bladder   GALLBLADDER SURGERY  2005   THYROIDECTOMY  1977    Social History   Socioeconomic History   Marital status: Divorced    Spouse name: Not on file   Number of children: Not on file   Years of education: Not on file   Highest education level: Not on file  Occupational History   Not on file  Tobacco Use   Smoking status: Former    Types: Cigarettes    Start date: 02/15/1988   Smokeless tobacco: Never  Vaping Use   Vaping Use: Never used  Substance and Sexual Activity   Alcohol use: Yes    Alcohol/week: 6.0 standard drinks of alcohol    Types: 6 Shots of liquor per week   Drug use: No   Sexual activity: Not Currently  Other Topics Concern   Not on file  Social History Narrative   Not on file   Social Determinants of Health   Financial Resource Strain: Not on file  Food Insecurity: Not on file  Transportation Needs: Not on file  Physical Activity: Not on file  Stress: Not on file  Social Connections: Not on file  Intimate Partner Violence: Not on file    Family History  Problem Relation Age of Onset   Kidney disease Mother    Kidney disease  Father    Cancer Father    Heart disease Father        Heart Disease before age 16- Atrial Fibrillation   Hyperlipidemia Father    Hypertension Father    Heart attack Father    Diabetes Sister    Heart disease Brother        Heart Disease before age 66   Hyperlipidemia Brother    Hypertension Brother    Peripheral vascular disease Brother     Current Outpatient Medications  Medication Sig Dispense Refill   albuterol (PROVENTIL) (2.5 MG/3ML) 0.083% nebulizer solution Take 3 mLs (2.5 mg total) by nebulization every 4 (four) hours as needed for wheezing or shortness of breath. 75 mL 0   aspirin 81 MG tablet Take 81 mg by mouth daily.     atorvastatin (LIPITOR) 40 MG tablet TAKE 1 TABLET (40 MG TOTAL) BY MOUTH DAILY.     Benralizumab (FASENRA) 30 MG/ML SOSY      calcium citrate-vitamin D (CITRACAL+D) 315-200 MG-UNIT tablet Take by mouth.     cholecalciferol (VITAMIN D) 1000 UNITS tablet Take 1,000 Units by mouth daily.     escitalopram (LEXAPRO) 10 MG tablet TAKE 1 TABLET (10 MG TOTAL) BY MOUTH DAILY.  furosemide (LASIX) 40 MG tablet TAKE 1 TABLET EVERY MORNING AS NEEDED FOR EDEMA     ibuprofen (ADVIL,MOTRIN) 800 MG tablet TAKE 1 TABLET EVERY 6 TO 8 HOURS AS NEEDED FOR PAIN  0   levothyroxine (SYNTHROID, LEVOTHROID) 100 MCG tablet TAKE 1 TABLET EVERY DAY     pantoprazole (PROTONIX) 40 MG tablet Take 40 mg by mouth daily.     azelastine (ASTELIN) 0.1 % nasal spray Place 2 sprays into both nostrils 2 (two) times daily. (Patient not taking: Reported on 04/14/2022) 30 mL 5   cefUROXime (CEFTIN) 500 MG tablet Take 500 mg by mouth 2 (two) times daily with a meal. (Patient not taking: Reported on 04/14/2022)     cetirizine (ZYRTEC) 10 MG tablet Take 10 mg by mouth daily. (Patient not taking: Reported on 04/14/2022)     Cyanocobalamin 1000 MCG TBCR Take by mouth. (Patient not taking: Reported on 04/14/2022)     fluticasone furoate-vilanterol (BREO ELLIPTA) 200-25 MCG/INH AEPB Inhale 1 puff into the  lungs daily. (Patient not taking: Reported on 04/14/2022) 1 each 5   No current facility-administered medications for this visit.    Allergies  Allergen Reactions   Hydrochlorothiazide Rash   Sulfa Antibiotics Rash     REVIEW OF SYSTEMS:   [X]  denotes positive finding, [ ]  denotes negative finding Cardiac  Comments:  Chest pain or chest pressure:    Shortness of breath upon exertion:    Short of breath when lying flat:    Irregular heart rhythm:        Vascular    Pain in calf, thigh, or hip brought on by ambulation:    Pain in feet at night that wakes you up from your sleep:     Blood clot in your veins:    Leg swelling:         Pulmonary    Oxygen at home:    Productive cough:     Wheezing:         Neurologic    Sudden weakness in arms or legs:     Sudden numbness in arms or legs:     Sudden onset of difficulty speaking or slurred speech:    Temporary loss of vision in one eye:     Problems with dizziness:         Gastrointestinal    Blood in stool:     Vomited blood:         Genitourinary    Burning when urinating:     Blood in urine:        Psychiatric    Major depression:         Hematologic    Bleeding problems:    Problems with blood clotting too easily:        Skin    Rashes or ulcers:        Constitutional    Fever or chills:      PHYSICAL EXAMINATION:  Vitals:   04/14/22 1351 04/14/22 1358  BP: (!) 164/77 (!) 153/80  Pulse: 74 78  Resp: 14   Temp: 97.7 F (36.5 C)   TempSrc: Temporal   SpO2: 97%   Weight: 131 lb (59.4 kg)   Height: 5' 5.5" (1.664 m)     General:  WDWN in NAD; vital signs documented above Gait: Not observed HENT: WNL, normocephalic Pulmonary: normal non-labored breathing , without Rales, rhonchi,  wheezing Cardiac: regular HR Abdomen: soft, NT, no masses Skin: without rashes Vascular Exam/Pulses:  Right  Left  Radial 2+ (normal) 2+ (normal)  DP absent 1+ (weak)  PT 1+ (weak) 1+ (weak)   Extremities:  without ischemic changes, without Gangrene , without cellulitis; without open wounds;  Musculoskeletal: no muscle wasting or atrophy  Neurologic: A&O X 3;  No focal weakness or paresthesias are detected Psychiatric:  The pt has Normal affect.   Non-Invasive Vascular Imaging:   60-79% stenosis B ICA    ASSESSMENT/PLAN:: 71 y.o. female here for follow up for carotid artery stenosis  -Subjectively, patient has not experienced any neurological changes since last office visit -Over the past several visits she has had 60 to 79% stenosis of bilateral internal carotid arteries.  Velocity continues to increase which likely puts her at the higher end of that range.  With that being said she is asymptomatic; no indication for revascularization of carotid arteries currently. -Continue aspirin and statin daily -It should also be noted that her podiatrist does not address the right foot bunion due to "poor circulation", she has a palpable right PT pulse however agree that if this is tolerable she should avoid risk of wound complications -Recheck carotid duplex in 6 months   Emilie Rutter, PA-C [facility] [phone]  Clinic MD:   Myra Gianotti

## 2022-04-18 ENCOUNTER — Other Ambulatory Visit: Payer: Self-pay

## 2022-04-18 DIAGNOSIS — I6523 Occlusion and stenosis of bilateral carotid arteries: Secondary | ICD-10-CM

## 2022-11-13 ENCOUNTER — Other Ambulatory Visit: Payer: Self-pay | Admitting: *Deleted

## 2022-11-13 DIAGNOSIS — I6523 Occlusion and stenosis of bilateral carotid arteries: Secondary | ICD-10-CM

## 2022-12-08 ENCOUNTER — Ambulatory Visit (HOSPITAL_COMMUNITY)
Admission: RE | Admit: 2022-12-08 | Discharge: 2022-12-08 | Disposition: A | Discharge status: Home / Self Care | Payer: Medicare HMO | Source: Ambulatory Visit | Attending: Vascular Surgery | Admitting: Vascular Surgery

## 2022-12-08 ENCOUNTER — Ambulatory Visit: Payer: Medicare HMO | Admitting: Physician Assistant

## 2022-12-08 VITALS — BP 164/83 | HR 89 | Temp 97.8°F

## 2022-12-08 DIAGNOSIS — I6523 Occlusion and stenosis of bilateral carotid arteries: Secondary | ICD-10-CM

## 2022-12-08 NOTE — Progress Notes (Signed)
Office Note   History of Present Illness   Alexandria Mitchell is a 72 y.o. (12-Apr-1951) female who presents for surveillance of carotid artery stenosis.  She has a history of bilateral internal carotid artery stenosis of 60 to 79%.  She has no history of stroke or TIA.  She returns today for follow-up.  She denies any recent diagnosis of CVA or TIA.  She denies any recent changes to vision, sudden weakness/numbness, slurred speech, or facial droop. She also denies any claudication, rest pain, or tissue loss.  She is still taking aspirin and statin.  Current Outpatient Medications  Medication Sig Dispense Refill   albuterol (PROVENTIL) (2.5 MG/3ML) 0.083% nebulizer solution Take 3 mLs (2.5 mg total) by nebulization every 4 (four) hours as needed for wheezing or shortness of breath. 75 mL 0   aspirin 81 MG tablet Take 81 mg by mouth daily.     atorvastatin (LIPITOR) 40 MG tablet TAKE 1 TABLET (40 MG TOTAL) BY MOUTH DAILY.     azelastine (ASTELIN) 0.1 % nasal spray Place 2 sprays into both nostrils 2 (two) times daily. 30 mL 5   Benralizumab (FASENRA) 30 MG/ML SOSY      cefUROXime (CEFTIN) 500 MG tablet Take 500 mg by mouth 2 (two) times daily with a meal.     cholecalciferol (VITAMIN D) 1000 UNITS tablet Take 1,000 Units by mouth daily.     escitalopram (LEXAPRO) 10 MG tablet TAKE 1 TABLET (10 MG TOTAL) BY MOUTH DAILY.     furosemide (LASIX) 40 MG tablet TAKE 1 TABLET EVERY MORNING AS NEEDED FOR EDEMA     ibuprofen (ADVIL,MOTRIN) 800 MG tablet TAKE 1 TABLET EVERY 6 TO 8 HOURS AS NEEDED FOR PAIN  0   levothyroxine (SYNTHROID, LEVOTHROID) 100 MCG tablet TAKE 1 TABLET EVERY DAY     pantoprazole (PROTONIX) 40 MG tablet Take 40 mg by mouth daily.     No current facility-administered medications for this visit.    REVIEW OF SYSTEMS (negative unless checked):   Cardiac:  []  Chest pain or chest pressure? []  Shortness of breath upon activity? []  Shortness of breath when lying flat? []   Irregular heart rhythm?  Vascular:  []  Pain in calf, thigh, or hip brought on by walking? []  Pain in feet at night that wakes you up from your sleep? []  Blood clot in your veins? []  Leg swelling?  Pulmonary:  []  Oxygen at home? []  Productive cough? []  Wheezing?  Neurologic:  []  Sudden weakness in arms or legs? []  Sudden numbness in arms or legs? []  Sudden onset of difficult speaking or slurred speech? []  Temporary loss of vision in one eye? []  Problems with dizziness?  Gastrointestinal:  []  Blood in stool? []  Vomited blood?  Genitourinary:  []  Burning when urinating? []  Blood in urine?  Psychiatric:  []  Major depression  Hematologic:  []  Bleeding problems? []  Problems with blood clotting?  Dermatologic:  []  Rashes or ulcers?  Constitutional:  []  Fever or chills?  Ear/Nose/Throat:  []  Change in hearing? []  Nose bleeds? []  Sore throat?  Musculoskeletal:  []  Back pain? []  Joint pain? []  Muscle pain?   Physical Examination   Vitals:   12/08/22 1422 12/08/22 1423  BP: 103/84 (!) 164/83  Pulse: 89   Temp: 97.8 F (36.6 C)   TempSrc: Temporal   SpO2: 95%    There is no height or weight on file to calculate BMI.  General:  WDWN in NAD; vital signs documented above Gait: Not  observed HENT: WNL, normocephalic Pulmonary: normal non-labored breathing  Cardiac: regular, carotid bruit bilaterally Abdomen: soft, NT, no masses Skin: without rashes Vascular Exam/Pulses: palpable radial pulses bilaterally Extremities: without ischemic changes, without gangrene , without cellulitis; without open wounds;  Musculoskeletal: no muscle wasting or atrophy  Neurologic: A&O X 3;  No focal weakness or paresthesias are detected Psychiatric:  The pt has Normal affect.  Non-Invasive Vascular Imaging   B Carotid Duplex (12/08/2022):  R ICA stenosis:  40-59%  R VA:  patent and antegrade L ICA stenosis:  40-59% L VA:  patent and antegrade   Medical Decision Making    Alexandria Mitchell is a 72 y.o. female who presents for surveillance of carotid artery stenosis  Based on the patient's vascular studies, her ICA stenosis bilaterally is essentially unchanged. Previous measurements gave graded her stenosis as >60% and today both arteries measure 40-59%. She denies any recent CVA or TIA diagnosis. She also denies any strokelike symptoms She has bilateral carotid bruits. She has palpable and equal radial pulses 2+ She can continue ASA and statin. She will follow up with our office in 6 months with repeat carotid duplex  Loel Dubonnet PA-C [facility] Office: [phone]  Call MD: Chestine Spore

## 2022-12-09 ENCOUNTER — Other Ambulatory Visit: Payer: Self-pay

## 2022-12-09 DIAGNOSIS — I6523 Occlusion and stenosis of bilateral carotid arteries: Secondary | ICD-10-CM

## 2023-06-15 ENCOUNTER — Ambulatory Visit: Payer: Medicare HMO | Admitting: Physician Assistant

## 2023-06-15 ENCOUNTER — Ambulatory Visit (HOSPITAL_COMMUNITY)
Admission: RE | Admit: 2023-06-15 | Discharge: 2023-06-15 | Disposition: A | Discharge status: Home / Self Care | Payer: Medicare HMO | Source: Ambulatory Visit | Attending: Surgery | Admitting: Surgery

## 2023-06-15 VITALS — BP 138/71 | HR 78 | Temp 97.9°F | Resp 18 | Ht 65.0 in | Wt 132.9 lb

## 2023-06-15 DIAGNOSIS — I6523 Occlusion and stenosis of bilateral carotid arteries: Secondary | ICD-10-CM

## 2023-06-15 NOTE — Progress Notes (Signed)
History of Present Illness:  Patient is a 72 y.o. year old female who presents for evaluation of carotid stenosis.  The patient denies symptoms of TIA, amaurosis, or stroke.  The carotid stenosis was initially found during a screening process and she was found to have > 80% stenosis.   She originally had her carotid stenosis identified in 2005 during a Life Screen valuation.  Specifically, she denies numbness or weakness in either extremity. She denies slurred speech. She denies amaurosis fugax.     Medical history includes asthma and hypercholesterolemia.  She takes a statin and ASA daily.  She stays active on a daily basis.     Past Medical History:  Diagnosis Date   Carotid artery occlusion    GERD (gastroesophageal reflux disease)    Thyroid disease     Past Surgical History:  Procedure Laterality Date   CHOLECYSTECTOMY  2005   Gall Bladder   GALLBLADDER SURGERY  2005   THYROIDECTOMY  1977     Social History Social History   Tobacco Use   Smoking status: Former    Types: Cigarettes    Start date: 02/15/1988   Smokeless tobacco: Never  Vaping Use   Vaping status: Never Used  Substance Use Topics   Alcohol use: Yes    Alcohol/week: 6.0 standard drinks of alcohol    Types: 6 Shots of liquor per week   Drug use: No    Family History Family History  Problem Relation Age of Onset   Kidney disease Mother    Kidney disease Father    Cancer Father    Heart disease Father        Heart Disease before age 80- Atrial Fibrillation   Hyperlipidemia Father    Hypertension Father    Heart attack Father    Diabetes Sister    Heart disease Brother        Heart Disease before age 54   Hyperlipidemia Brother    Hypertension Brother    Peripheral vascular disease Brother     Allergies  Allergies  Allergen Reactions   Hydrochlorothiazide Rash   Sulfa Antibiotics Rash     Current Outpatient Medications  Medication Sig Dispense Refill   albuterol (PROVENTIL)  (2.5 MG/3ML) 0.083% nebulizer solution Take 3 mLs (2.5 mg total) by nebulization every 4 (four) hours as needed for wheezing or shortness of breath. 75 mL 0   aspirin 81 MG tablet Take 81 mg by mouth daily.     atorvastatin (LIPITOR) 40 MG tablet TAKE 1 TABLET (40 MG TOTAL) BY MOUTH DAILY.     azelastine (ASTELIN) 0.1 % nasal spray Place 2 sprays into both nostrils 2 (two) times daily. 30 mL 5   Benralizumab (FASENRA) 30 MG/ML SOSY      cefUROXime (CEFTIN) 500 MG tablet Take 500 mg by mouth 2 (two) times daily with a meal.     cholecalciferol (VITAMIN D) 1000 UNITS tablet Take 1,000 Units by mouth daily.     escitalopram (LEXAPRO) 10 MG tablet TAKE 1 TABLET (10 MG TOTAL) BY MOUTH DAILY.     furosemide (LASIX) 40 MG tablet TAKE 1 TABLET EVERY MORNING AS NEEDED FOR EDEMA     ibuprofen (ADVIL,MOTRIN) 800 MG tablet TAKE 1 TABLET EVERY 6 TO 8 HOURS AS NEEDED FOR PAIN  0   levothyroxine (SYNTHROID, LEVOTHROID) 100 MCG tablet TAKE 1 TABLET EVERY DAY     pantoprazole (PROTONIX) 40 MG tablet Take 40 mg by mouth daily.  No current facility-administered medications for this visit.    ROS:   General:  No weight loss, Fever, chills  HEENT: No recent headaches, no nasal bleeding, no visual changes, no sore throat  Neurologic: No dizziness, blackouts, seizures. No recent symptoms of stroke or mini- stroke. No recent episodes of slurred speech, or temporary blindness.  Cardiac: No recent episodes of chest pain/pressure, no shortness of breath at rest.  No shortness of breath with exertion.  Denies history of atrial fibrillation or irregular heartbeat  Vascular: No history of rest pain in feet.  No history of claudication.  No history of non-healing ulcer, No history of DVT   Pulmonary: No home oxygen, no productive cough, no hemoptysis,  No asthma or wheezing  Musculoskeletal:  [ ]  Arthritis, [ ]  Low back pain,  [ ]  Joint pain  Hematologic:No history of hypercoagulable state.  No history of easy  bleeding.  No history of anemia  Gastrointestinal: No hematochezia or melena,  No gastroesophageal reflux, no trouble swallowing  Urinary: [ ]  chronic Kidney disease, [ ]  on HD - [ ]  MWF or [ ]  TTHS, [ ]  Burning with urination, [ ]  Frequent urination, [ ]  Difficulty urinating;   Skin: No rashes  Psychological: No history of anxiety,  No history of depression   Physical Examination  Vitals:   06/15/23 1341  BP: 138/71  Pulse: 78  Resp: 18  Temp: 97.9 F (36.6 C)  TempSrc: Temporal  SpO2: 93%  Weight: 132 lb 14.4 oz (60.3 kg)  Height: 5\' 5"  (1.651 m)    Body mass index is 22.12 kg/m.  General:  Alert and oriented, no acute distress HEENT: Normal Neck: No bruit or JVD Pulmonary: Clear to auscultation bilaterally Cardiac: Regular Rate and Rhythm without murmur Gastrointestinal: Soft, non-tender, non-distended, no mass, no scars Skin: No rash Extremity Pulses:  2+ radial bilaterally Musculoskeletal: No deformity or edema  Neurologic: Upper and lower extremity motor 5/5 and symmetric  DATA:  Right Carotid Findings:  +---------+--------+-------+--------+---------------------------------+----  ----+          PSV cm/sEDV    StenosisPlaque Description                Comments                  cm/s                                                       +---------+--------+-------+--------+---------------------------------+----  ----+  CCA Prox 65      20                                                         +---------+--------+-------+--------+---------------------------------+----  ----+  CCA Mid  57      16                                                         +---------+--------+-------+--------+---------------------------------+----  ----+  CCA     56  14                                                         Distal                                                                       +---------+--------+-------+--------+---------------------------------+----  ----+  ICA Prox 159     51     40-59%  irregular, heterogenous and                                                 calcific                                    +---------+--------+-------+--------+---------------------------------+----  ----+  ICA Mid  145     43     40-59%  heterogenous                                +---------+--------+-------+--------+---------------------------------+----  ----+  ICA     176     50     40-59%  heterogenous                                Distal                                                                      +---------+--------+-------+--------+---------------------------------+----  ----+  ECA     196     19     >50%    heterogenous                                +---------+--------+-------+--------+---------------------------------+----  ----+   +----------+--------+-------+----------------+-------------------+           PSV cm/sEDV cmsDescribe        Arm Pressure (mmHG)  +----------+--------+-------+----------------+-------------------+  [id number]            Multiphasic, [id number]                  +----------+--------+-------+----------------+-------------------+   +---------+--------+--+--------+-+---------+  VertebralPSV cm/s29EDV cm/s7Antegrade  +---------+--------+--+--------+-+---------+      Left Carotid Findings:  +----------+--------+--------+--------+------------------------------+-----  ----+           PSV cm/sEDV cm/sStenosisPlaque Description             Comments   +----------+--------+--------+--------+------------------------------+-----  ----+  CCA Prox  84      18                                                        +----------+--------+--------+--------+------------------------------+-----  ----+  CCA Mid   67      18                                                         +----------+--------+--------+--------+------------------------------+-----  ----+  CCA Distal63      15      <50%    irregular, heterogenous and    Shadowing                                   calcific                                  +----------+--------+--------+--------+------------------------------+-----  ----+  ICA Prox  177     56              irregular, heterogenous and    Shadowing                                   calcific                                  +----------+--------+--------+--------+------------------------------+-----  ----+  ICA Mid   320     97      60-79%  heterogenous and calcific      Shadowing  +----------+--------+--------+--------+------------------------------+-----  ----+  ICA Distal85      28                                                        +----------+--------+--------+--------+------------------------------+-----  ----+  ECA      156     18                                                        +----------+--------+--------+--------+------------------------------+-----  ----+   +----------+--------+--------+----------------+-------------------+           PSV cm/sEDV cm/sDescribe        Arm Pressure (mmHG)  +----------+--------+--------+----------------+-------------------+  Subclavian80             Multiphasic, [id number]                  +----------+--------+--------+----------------+-------------------+   +---------+--------+--+--------+--+---------+  VertebralPSV cm/s89EDV cm/s21Antegrade  +---------+--------+--+--------+--+---------+         Summary:  Right Carotid: Velocities in the right ICA are consistent with a 40-59%                 stenosis. Non-hemodynamically significant plaque <50% noted  in                the CCA. The ECA appears >50% stenosed.   Left Carotid: Velocities in the left ICA are consistent with a 60-79%  stenosis.  Non-hemodynamically significant plaque <50% noted in the  CCA.               Acoustic shadowing in the proximal-mid ICA limits assessment  of               this area. Left ICA stenosis may be greater than detected.   Vertebrals:  Bilateral vertebral arteries demonstrate antegrade flow.  Subclavians: Normal flow hemodynamics were seen in bilateral subclavian               arteries.    ASSESSMENT/PLAN: She has right ICA 40-59% and left ICA 60-79% Her duplex has got up and down from study to study.  She does not have symptoms of stroke/TIA and her ICA's B are < 80% stenosis.  She will continue to follow up every 6 months for surveillance.  If she has ICA stenosis > 80% we will get a CTA for more definitive measurements.  If she has stroke/TIA symptoms she will call 911.      Alexandria Mitchell  [facility] Office: [phone]  MD in clinic [place]

## 2023-07-10 ENCOUNTER — Other Ambulatory Visit: Payer: Self-pay

## 2023-07-10 DIAGNOSIS — I6523 Occlusion and stenosis of bilateral carotid arteries: Secondary | ICD-10-CM

## 2023-12-14 ENCOUNTER — Encounter: Payer: Self-pay | Admitting: Physician Assistant

## 2023-12-14 ENCOUNTER — Ambulatory Visit (HOSPITAL_COMMUNITY)
Admission: RE | Admit: 2023-12-14 | Discharge: 2023-12-14 | Disposition: A | Discharge status: Home / Self Care | Payer: Medicare HMO | Source: Ambulatory Visit | Attending: Surgery | Admitting: Surgery

## 2023-12-14 ENCOUNTER — Ambulatory Visit: Payer: Medicare HMO | Admitting: Physician Assistant

## 2023-12-14 VITALS — BP 188/84 | HR 73 | Temp 98.3°F | Ht 65.0 in | Wt 135.5 lb

## 2023-12-14 DIAGNOSIS — I6523 Occlusion and stenosis of bilateral carotid arteries: Secondary | ICD-10-CM | POA: Diagnosis present

## 2023-12-14 NOTE — Progress Notes (Signed)
 Office Note   History of Present Illness   Alexandria Mitchell is a 73 y.o. (1951-09-04) female who presents for surveillance of carotid artery stenosis.  She has no prior history of CVA or TIA.  Her most recent duplex demonstrated right ICA 40 to 59% stenosis and left ICA 60 to 79% stenosis.  The patient returns today for follow up.  She denies any recent strokelike symptoms such as slurred speech, facial droop, sudden visual changes, or sudden weakness/numbness.  She also denies any recent diagnosis of CVA or TIA.  She is still taking her daily aspirin and statin.  Current Outpatient Medications  Medication Sig Dispense Refill   aspirin 81 MG tablet Take 81 mg by mouth daily.     atorvastatin (LIPITOR) 40 MG tablet TAKE 1 TABLET (40 MG TOTAL) BY MOUTH DAILY.     levothyroxine (SYNTHROID, LEVOTHROID) 100 MCG tablet 75 mcg.     cholecalciferol (VITAMIN D) 1000 UNITS tablet Take 1,000 Units by mouth daily.     dupilumab (DUPIXENT) 300 MG/2ML prefilled syringe Inject into the skin.     escitalopram (LEXAPRO) 10 MG tablet TAKE 1 TABLET (10 MG TOTAL) BY MOUTH DAILY.     furosemide (LASIX) 40 MG tablet TAKE 1 TABLET EVERY MORNING AS NEEDED FOR EDEMA     ibuprofen (ADVIL,MOTRIN) 800 MG tablet TAKE 1 TABLET EVERY 6 TO 8 HOURS AS NEEDED FOR PAIN  0   pantoprazole (PROTONIX) 40 MG tablet Take 40 mg by mouth daily.     No current facility-administered medications for this visit.    REVIEW OF SYSTEMS (negative unless checked):   Cardiac:  []  Chest pain or chest pressure? []  Shortness of breath upon activity? []  Shortness of breath when lying flat? []  Irregular heart rhythm?  Vascular:  []  Pain in calf, thigh, or hip brought on by walking? []  Pain in feet at night that wakes you up from your sleep? []  Blood clot in your veins? []  Leg swelling?  Pulmonary:  []  Oxygen at home? []  Productive cough? []  Wheezing?  Neurologic:  []  Sudden weakness in arms or legs? []  Sudden numbness in arms  or legs? []  Sudden onset of difficult speaking or slurred speech? []  Temporary loss of vision in one eye? []  Problems with dizziness?  Gastrointestinal:  []  Blood in stool? []  Vomited blood?  Genitourinary:  []  Burning when urinating? []  Blood in urine?  Psychiatric:  []  Major depression  Hematologic:  []  Bleeding problems? []  Problems with blood clotting?  Dermatologic:  []  Rashes or ulcers?  Constitutional:  []  Fever or chills?  Ear/Nose/Throat:  []  Change in hearing? []  Nose bleeds? []  Sore throat?  Musculoskeletal:  []  Back pain? []  Joint pain? []  Muscle pain?   Physical Examination   Vitals:   12/14/23 1458 12/14/23 1500  BP: (!) 199/91 (!) 207/94  Pulse: 73   Temp: 98.3 F (36.8 C)   TempSrc: Temporal   SpO2: 95%   Weight: 135 lb 8 oz (61.5 kg)   Height: 5\' 5"  (1.651 m)    Body mass index is 22.55 kg/m.  General:  WDWN in NAD; vital signs documented above Gait: Not observed HENT: WNL, normocephalic Pulmonary: normal non-labored breathing , without rales, rhonchi,  wheezing Cardiac: regular Abdomen: soft, NT, no masses Skin: without rashes Vascular Exam/Pulses: palpable radial pulses bilaterally Extremities: without ischemic changes, without gangrene , without cellulitis; without open wounds;  Musculoskeletal: no muscle wasting or atrophy  Neurologic: A&O X 3;  No focal  weakness or paresthesias are detected Psychiatric:  The pt has Normal affect.  Non-Invasive Vascular Imaging   Bilateral Carotid Duplex (12/14/2023):  R ICA stenosis:  40-59% R VA:  patent and antegrade L ICA stenosis:  40-59% L VA:  patent and antegrade   Medical Decision Making   Alexandria Mitchell is a 73 y.o. female who presents for surveillance of carotid artery stenosis  Based on the patient's vascular studies, her carotid artery stenosis is stable bilaterally.  Measurements today are 40 to 59% bilaterally.  Previous measurements on the right were 40 to 59% and on the  left were 60 to 79% She denies any strokelike symptoms such as slurred speech, facial droop, sudden visual changes, or sudden weakness/numbness. She is neurologically at baseline on exam.  She has palpable and equal radial pulses bilaterally. She can follow-up with our office in 6 months with repeat carotid duplex   Loel Dubonnet PA-C [facility] Office: [phone]  Clinic MD: Myra Gianotti

## 2023-12-16 ENCOUNTER — Other Ambulatory Visit: Payer: Self-pay

## 2023-12-16 DIAGNOSIS — I6523 Occlusion and stenosis of bilateral carotid arteries: Secondary | ICD-10-CM

## 2024-06-20 ENCOUNTER — Encounter (HOSPITAL_COMMUNITY)

## 2024-06-20 ENCOUNTER — Ambulatory Visit
